# Patient Record
Sex: Female | Born: 1955 | Race: White | Hispanic: No | Marital: Single | Smoking: Current every day smoker
Health system: Southern US, Community
[De-identification: ages and names within clinical notes are randomized; demographics above are authoritative.]

## PROBLEM LIST (undated history)

## (undated) DIAGNOSIS — E7800 Pure hypercholesterolemia, unspecified: Secondary | ICD-10-CM

## (undated) DIAGNOSIS — I10 Essential (primary) hypertension: Secondary | ICD-10-CM

## (undated) DIAGNOSIS — F329 Major depressive disorder, single episode, unspecified: Secondary | ICD-10-CM

## (undated) DIAGNOSIS — C801 Malignant (primary) neoplasm, unspecified: Secondary | ICD-10-CM

## (undated) DIAGNOSIS — E079 Disorder of thyroid, unspecified: Secondary | ICD-10-CM

## (undated) DIAGNOSIS — F32A Depression, unspecified: Secondary | ICD-10-CM

## (undated) HISTORY — PX: PACEMAKER IMPLANT: EP1218

---

## 2008-06-28 ENCOUNTER — Emergency Department: Payer: Self-pay | Admitting: Emergency Medicine

## 2010-03-28 ENCOUNTER — Ambulatory Visit: Payer: Self-pay | Admitting: Family Medicine

## 2017-04-04 ENCOUNTER — Encounter: Payer: Self-pay | Admitting: Emergency Medicine

## 2017-04-04 ENCOUNTER — Emergency Department
Admission: EM | Admit: 2017-04-04 | Discharge: 2017-04-04 | Disposition: A | Discharge status: Home / Self Care | Payer: Medicaid Other | Attending: Emergency Medicine | Admitting: Emergency Medicine

## 2017-04-04 DIAGNOSIS — R45851 Suicidal ideations: Secondary | ICD-10-CM | POA: Diagnosis present

## 2017-04-04 DIAGNOSIS — F332 Major depressive disorder, recurrent severe without psychotic features: Secondary | ICD-10-CM | POA: Diagnosis not present

## 2017-04-04 DIAGNOSIS — I10 Essential (primary) hypertension: Secondary | ICD-10-CM | POA: Insufficient documentation

## 2017-04-04 DIAGNOSIS — E039 Hypothyroidism, unspecified: Secondary | ICD-10-CM

## 2017-04-04 DIAGNOSIS — F329 Major depressive disorder, single episode, unspecified: Secondary | ICD-10-CM | POA: Diagnosis not present

## 2017-04-04 DIAGNOSIS — Z79899 Other long term (current) drug therapy: Secondary | ICD-10-CM | POA: Insufficient documentation

## 2017-04-04 DIAGNOSIS — F339 Major depressive disorder, recurrent, unspecified: Secondary | ICD-10-CM

## 2017-04-04 DIAGNOSIS — F32A Depression, unspecified: Secondary | ICD-10-CM

## 2017-04-04 HISTORY — DX: Disorder of thyroid, unspecified: E07.9

## 2017-04-04 HISTORY — DX: Pure hypercholesterolemia, unspecified: E78.00

## 2017-04-04 HISTORY — DX: Essential (primary) hypertension: I10

## 2017-04-04 LAB — CBC
HEMATOCRIT: 42.6 % (ref 35.0–47.0)
HEMOGLOBIN: 14.6 g/dL (ref 12.0–16.0)
MCH: 33.8 pg (ref 26.0–34.0)
MCHC: 34.4 g/dL (ref 32.0–36.0)
MCV: 98.4 fL (ref 80.0–100.0)
Platelets: 294 10*3/uL (ref 150–440)
RBC: 4.33 MIL/uL (ref 3.80–5.20)
RDW: 13.8 % (ref 11.5–14.5)
WBC: 9.2 10*3/uL (ref 3.6–11.0)

## 2017-04-04 LAB — COMPREHENSIVE METABOLIC PANEL
ALT: 109 U/L — ABNORMAL HIGH (ref 14–54)
ANION GAP: 9 (ref 5–15)
AST: 78 U/L — ABNORMAL HIGH (ref 15–41)
Albumin: 4.2 g/dL (ref 3.5–5.0)
Alkaline Phosphatase: 82 U/L (ref 38–126)
BUN: 15 mg/dL (ref 6–20)
CHLORIDE: 103 mmol/L (ref 101–111)
CO2: 26 mmol/L (ref 22–32)
CREATININE: 0.94 mg/dL (ref 0.44–1.00)
Calcium: 9.3 mg/dL (ref 8.9–10.3)
Glucose, Bld: 101 mg/dL — ABNORMAL HIGH (ref 65–99)
POTASSIUM: 4.2 mmol/L (ref 3.5–5.1)
Sodium: 138 mmol/L (ref 135–145)
TOTAL PROTEIN: 7.7 g/dL (ref 6.5–8.1)
Total Bilirubin: 1 mg/dL (ref 0.3–1.2)

## 2017-04-04 LAB — URINE DRUG SCREEN, QUALITATIVE (ARMC ONLY)
Amphetamines, Ur Screen: NOT DETECTED
BARBITURATES, UR SCREEN: NOT DETECTED
BENZODIAZEPINE, UR SCRN: NOT DETECTED
COCAINE METABOLITE, UR ~~LOC~~: NOT DETECTED
Cannabinoid 50 Ng, Ur ~~LOC~~: POSITIVE — AB
MDMA (Ecstasy)Ur Screen: NOT DETECTED
METHADONE SCREEN, URINE: NOT DETECTED
OPIATE, UR SCREEN: NOT DETECTED
PHENCYCLIDINE (PCP) UR S: NOT DETECTED
Tricyclic, Ur Screen: NOT DETECTED

## 2017-04-04 LAB — ACETAMINOPHEN LEVEL: Acetaminophen (Tylenol), Serum: <10 ug/mL — ABNORMAL LOW (ref 10–30)

## 2017-04-04 LAB — SALICYLATE LEVEL: Salicylate Lvl: <7 mg/dL (ref 2.8–30.0)

## 2017-04-04 LAB — T4, FREE: Free T4: 0.86 ng/dL (ref 0.61–1.12)

## 2017-04-04 LAB — ETHANOL: Alcohol, Ethyl (B): <5 mg/dL (ref <5)

## 2017-04-04 LAB — TSH: TSH: 10.216 u[IU]/mL — ABNORMAL HIGH (ref 0.350–4.500)

## 2017-04-04 MED ORDER — ARIPIPRAZOLE 5 MG PO TABS: 5.0000 mg | ORAL_TABLET | Freq: Every day | ORAL | 1 refills | Status: DC

## 2017-04-04 NOTE — ED Notes (Signed)

## 2017-04-04 NOTE — ED Notes (Signed)
BEHAVIORAL HEALTH ROUNDING Patient sleeping: No. Patient alert and oriented: yes Behavior appropriate: Yes.  ; If no, describe:  Nutrition and fluids offered: yes Toileting and hygiene offered: Yes  Sitter present: q15 minute observations and security  monitoring Law enforcement present: Yes  ODS  

## 2017-04-04 NOTE — ED Triage Notes (Signed)
Pt reports history of depression. Has taken Zoloft in the past. Denies taking any antidepressants currently. Pt reports suicidal ideation but denies actual plan to commit suicide. Pt tearful in triage. Pt presents voluntarily.

## 2017-04-04 NOTE — ED Notes (Signed)
Patient assigned to appropriate care area.  Introduced self to pt  Patient oriented to unit/care area: Informed that, for their safety, care areas are designed for safety and monitored by security cameras at all times; and visiting hours explained to patient. Patient verbalizes understanding, and verbal contract for safety obtained  Environment secured  Clothing changed in triage

## 2017-04-04 NOTE — ED Provider Notes (Addendum)
[facility] Emergency Department Provider Note       Time seen: ----------------------------------------- 11:26 AM on 04/04/2017 -----------------------------------------     I have reviewed the triage vital signs and the nursing notes.   HISTORY   Chief Complaint Depression and Suicidal Ideation    HPI Debbie Henderson is a 61 y.o. female who presents to the ED for depression. Patient reports taking Effexor and Zoloft in the past. She is not currently taking any. She does describe some suicidal thought but not actively having any plan. She is very tearful and presents here voluntarily. She denies any other medical complaints.   Past Medical History:  Diagnosis Date  . Elevated cholesterol   . Hypertension   . Thyroid disease     There are no active problems to display for this patient.   No past surgical history on file.  Allergies Percocet [oxycodone-acetaminophen] and Vicodin [hydrocodone-acetaminophen]  Social History Social History  Substance Use Topics  . Smoking status: Not on file  . Smokeless tobacco: Not on file  . Alcohol use Not on file    Review of Systems Constitutional: Negative for fever. Cardiovascular: Negative for chest pain. Respiratory: Negative for shortness of breath. Gastrointestinal: Negative for abdominal pain, vomiting and diarrhea. Genitourinary: Negative for dysuria. Musculoskeletal: Negative for back pain. Skin: Negative for rash. Neurological: Negative for headaches, focal weakness or numbness. Psychiatric: Positive for depression, suicidal ideation  10-point ROS otherwise negative.  ____________________________________________   PHYSICAL EXAM:  VITAL SIGNS: ED Triage Vitals  Enc Vitals Group     BP 04/04/17 1100 (!) 182/85     Pulse Rate 04/04/17 1100 69     Resp 04/04/17 1100 18     Temp 04/04/17 1100 97.5 F (36.4 C)     Temp Source 04/04/17 1100 Oral     SpO2 04/04/17 1100 97 %   Weight 04/04/17 1101 200 lb (90.7 kg)     Height 04/04/17 1101 5\' 7"  (1.702 m)     Head Circumference --      Peak Flow --      Pain Score --      Pain Loc --      Pain Edu? --      Excl. in [place]? --     Constitutional: Alert and oriented. Tearful, in no distress Eyes: Conjunctivae are normal. PERRL. Normal extraocular movements. ENT   Head: Normocephalic and atraumatic.   Nose: No congestion/rhinnorhea.   Mouth/Throat: Mucous membranes are moist.   Neck: No stridor. Cardiovascular: Normal rate, regular rhythm. No murmurs, rubs, or gallops. Respiratory: Normal respiratory effort without tachypnea nor retractions. Breath sounds are clear and equal bilaterally. No wheezes/rales/rhonchi. Gastrointestinal: Soft and nontender. Normal bowel sounds Musculoskeletal: Nontender with normal range of motion in extremities. No lower extremity tenderness nor edema. Neurologic:  Normal speech and language. No gross focal neurologic deficits are appreciated.  Skin:  Skin is warm, dry and intact. No rash noted. Psychiatric: Depressed and tearful ____________________________________________  ED COURSE:  Pertinent labs & imaging results that were available during my care of the patient were reviewed by me and considered in my medical decision making (see chart for details). Patient presents for depressive symptoms, we will assess with labs as indicated. Clinical Course as of Apr 04 1521  Fri Apr 04, 2017  1517 Patient has been cleared by Dr. Weber Cooks for discharge  [JW]    Clinical Course User Index [JW] Earleen Newport, MD   Procedures ____________________________________________   LABS (pertinent  positives/negatives)  Labs Reviewed  COMPREHENSIVE METABOLIC PANEL - Abnormal; Notable for the following:       Result Value   Glucose, Bld 101 (*)    AST 78 (*)    ALT 109 (*)    All other components within normal limits  ACETAMINOPHEN LEVEL - Abnormal; Notable for the  following:    Acetaminophen (Tylenol), Serum <10 (*)    All other components within normal limits  URINE DRUG SCREEN, QUALITATIVE ([facility] ONLY) - Abnormal; Notable for the following:    Cannabinoid 50 Ng, Ur [place] POSITIVE (*)    All other components within normal limits  TSH - Abnormal; Notable for the following:    TSH 10.216 (*)    All other components within normal limits  ETHANOL  SALICYLATE LEVEL  CBC  T4, FREE   ____________________________________________  FINAL ASSESSMENT AND PLAN  Major depressive disorder  Plan: Patient's labs were dictated above. Patient had presented for depression with possible suicidal ideation. She appears medically stable for psychiatric evaluation.  Patient was cleared by Dr. Weber Cooks for discharge and given a prescription for Abilify. Earleen Newport, MD   Note: This note was generated in part or whole with voice recognition software. Voice recognition is usually quite accurate but there are transcription errors that can and very often do occur. I apologize for any typographical errors that were not detected and corrected.     Earleen Newport, MD 04/04/17 1154    Earleen Newport, MD 04/04/17 1404    Earleen Newport, MD 04/04/17 [phone]

## 2017-04-04 NOTE — ED Notes (Signed)
Called lab ask about adding TSH , Wells Guiles from lab agree to add on TSH

## 2017-04-04 NOTE — ED Notes (Signed)
Patient gave Officer Mabe with [place] police permission  to give her black Samsung phone to her boyfriend Anthonette Legato , officer asked patient to take her phone out of bag and her bag was placed back .

## 2017-04-04 NOTE — ED Notes (Signed)
Lunch was given to patient 

## 2017-04-04 NOTE — Consult Note (Signed)
[place] Psychiatry Consult   Reason for Consult:  Consult for 61 year old woman who came voluntarily to the emergency room for evaluation of depression Referring Physician:  Jimmye Norman Patient Identification: Debbie Henderson MRN:  [id number] Principal Diagnosis: Severe recurrent major depression without psychotic features [facility]) Diagnosis:   Patient Active Problem List   Diagnosis Date Noted  . Severe recurrent major depression without psychotic features ([place]) [F33.2] 04/04/2017  . Hypothyroid [E03.9] 04/04/2017    Total Time spent with patient: 1 hour  Subjective:   Debbie Henderson is a 61 y.o. female patient admitted with "I've got a history of depression".  HPI:  Patient interviewed. Chart reviewed. 61 year old woman with a history of depression. She talked to her primary care doctor today and was referred to come here to the emergency room to be evaluated. Patient reports that she's had trouble with depression for many years. The symptoms come and go but her current spell is been going on at least all through the winter. Mood feels sad and down all the time. Has crying spells. Energy level is low. Lack of interest or energy to do things. She sleeps okay because she takes Klonopin at night. Eats okay. Denies having any suicidal thoughts or intentions at all. Not having any psychotic thoughts. Patient is currently not taking an antidepressant medicine having stopped her most recent medicine which was Zoloft because she thought it was not working. Doesn't report any other significant single major stressor.  Social history: Patient lives with her long-term boyfriend and her stepdaughter. Not working outside the home.  Medical history: His history of hypothyroidism and takes Synthroid for it. Notably her TSH is abnormal today suggesting she may not be on enough.  Substance abuse history: Patient admits that she uses marijuana fairly regularly 1 or 2 times a week also drinks alcohol  very occasionally can remember the last time she used heavily although she says she often will have a single drink most nights.  Past Psychiatric History: No history of psychiatric hospitalization. No history of suicide attempts. Patient has a long list of antidepressants she has tried including Effexor, Zoloft, Pristiq, Remeron, Wellbutrin. She says Effexor helped a little bit for a while although she didn't like the side effects and other medicines have not helped at all. She says that she occasionally will get manic-like spells but she clarifies that they never last even a full day. No apparent history of psychotic mania.  Risk to Self: Is patient at risk for suicide?: Yes Risk to Others:   Prior Inpatient Therapy:   Prior Outpatient Therapy:    Past Medical History:  Past Medical History:  Diagnosis Date  . Elevated cholesterol   . Hypertension   . Thyroid disease    No past surgical history on file. Family History: No family history on file. Family Psychiatric  History: She had a grandfather committed suicide Social History:  History  Alcohol use Not on file     History  Drug use: Unknown    Social History   Social History  . Marital status: Single    Spouse name: N/A  . Number of children: N/A  . Years of education: N/A   Social History Main Topics  . Smoking status: None  . Smokeless tobacco: None  . Alcohol use None  . Drug use: Unknown  . Sexual activity: Not Asked   Other Topics Concern  . None   Social History Narrative  . None   Additional Social History:  Allergies:   Allergies  Allergen Reactions  . Percocet [Oxycodone-Acetaminophen] Itching and Nausea And Vomiting  . Vicodin [Hydrocodone-Acetaminophen] Itching and Nausea And Vomiting    Labs:  Results for orders placed or performed during the hospital encounter of 04/04/17 (from the past 48 hour(s))  Comprehensive metabolic panel     Status: Abnormal   Collection Time: 04/04/17 11:09 AM   Result Value Ref Range   Sodium 138 135 - 145 mmol/L   Potassium 4.2 3.5 - 5.1 mmol/L   Chloride 103 101 - 111 mmol/L   CO2 26 22 - 32 mmol/L   Glucose, Bld 101 (H) 65 - 99 mg/dL   BUN 15 6 - 20 mg/dL   Creatinine, Ser 0.94 0.44 - 1.00 mg/dL   Calcium 9.3 8.9 - 10.3 mg/dL   Total Protein 7.7 6.5 - 8.1 g/dL   Albumin 4.2 3.5 - 5.0 g/dL   AST 78 (H) 15 - 41 U/L   ALT 109 (H) 14 - 54 U/L   Alkaline Phosphatase 82 38 - 126 U/L   Total Bilirubin 1.0 0.3 - 1.2 mg/dL   GFR calc non Af Amer >60 >60 mL/min   GFR calc Af Amer >60 >60 mL/min    Comment: (NOTE) The eGFR has been calculated using the CKD EPI equation. This calculation has not been validated in all clinical situations. eGFR's persistently <60 mL/min signify possible Chronic Kidney Disease.    Anion gap 9 5 - 15  Ethanol     Status: None   Collection Time: 04/04/17 11:09 AM  Result Value Ref Range   Alcohol, Ethyl (B) <5 <5 mg/dL    Comment:        LOWEST DETECTABLE LIMIT FOR SERUM ALCOHOL IS 5 mg/dL FOR MEDICAL PURPOSES ONLY   Salicylate level     Status: None   Collection Time: 04/04/17 11:09 AM  Result Value Ref Range   Salicylate Lvl <7.6 2.8 - 30.0 mg/dL  Acetaminophen level     Status: Abnormal   Collection Time: 04/04/17 11:09 AM  Result Value Ref Range   Acetaminophen (Tylenol), Serum <10 (L) 10 - 30 ug/mL    Comment:        THERAPEUTIC CONCENTRATIONS VARY SIGNIFICANTLY. A RANGE OF 10-30 ug/mL MAY BE AN EFFECTIVE CONCENTRATION FOR MANY PATIENTS. HOWEVER, SOME ARE BEST TREATED AT CONCENTRATIONS OUTSIDE THIS RANGE. ACETAMINOPHEN CONCENTRATIONS >150 ug/mL AT 4 HOURS AFTER INGESTION AND >50 ug/mL AT 12 HOURS AFTER INGESTION ARE OFTEN ASSOCIATED WITH TOXIC REACTIONS.   cbc     Status: None   Collection Time: 04/04/17 11:09 AM  Result Value Ref Range   WBC 9.2 3.6 - 11.0 K/uL   RBC 4.33 3.80 - 5.20 MIL/uL   Hemoglobin 14.6 12.0 - 16.0 g/dL   HCT 42.6 35.0 - 47.0 %   MCV 98.4 80.0 - 100.0 fL   MCH  33.8 26.0 - 34.0 pg   MCHC 34.4 32.0 - 36.0 g/dL   RDW 13.8 11.5 - 14.5 %   Platelets 294 150 - 440 K/uL  TSH     Status: Abnormal   Collection Time: 04/04/17 11:09 AM  Result Value Ref Range   TSH 10.216 (H) 0.350 - 4.500 uIU/mL    Comment: Performed by a 3rd Generation assay with a functional sensitivity of <=0.01 uIU/mL.  T4, free     Status: None   Collection Time: 04/04/17 11:09 AM  Result Value Ref Range   Free T4 0.86 0.61 - 1.12 ng/dL  Comment: (NOTE) Biotin ingestion may interfere with free T4 tests. If the results are inconsistent with the TSH level, previous test results, or the clinical presentation, then consider biotin interference. If needed, order repeat testing after stopping biotin.   Urine Drug Screen, Qualitative     Status: Abnormal   Collection Time: 04/04/17  1:29 PM  Result Value Ref Range   Tricyclic, Ur Screen NONE DETECTED NONE DETECTED   Amphetamines, Ur Screen NONE DETECTED NONE DETECTED   MDMA (Ecstasy)Ur Screen NONE DETECTED NONE DETECTED   Cocaine Metabolite,Ur [place] NONE DETECTED NONE DETECTED   Opiate, Ur Screen NONE DETECTED NONE DETECTED   Phencyclidine (PCP) Ur S NONE DETECTED NONE DETECTED   Cannabinoid 50 Ng, Ur [place] POSITIVE (A) NONE DETECTED   Barbiturates, Ur Screen NONE DETECTED NONE DETECTED   Benzodiazepine, Ur Scrn NONE DETECTED NONE DETECTED   Methadone Scn, Ur NONE DETECTED NONE DETECTED    Comment: (NOTE) 665  Tricyclics, urine               Cutoff 1000 ng/mL 200  Amphetamines, urine             Cutoff 1000 ng/mL 300  MDMA (Ecstasy), urine           Cutoff 500 ng/mL 400  Cocaine Metabolite, urine       Cutoff 300 ng/mL 500  Opiate, urine                   Cutoff 300 ng/mL 600  Phencyclidine (PCP), urine      Cutoff 25 ng/mL 700  Cannabinoid, urine              Cutoff 50 ng/mL 800  Barbiturates, urine             Cutoff 200 ng/mL 900  Benzodiazepine, urine           Cutoff 200 ng/mL 1000 Methadone, urine                Cutoff 300  ng/mL 1100 1200 The urine drug screen provides only a preliminary, unconfirmed 1300 analytical test result and should not be used for non-medical 1400 purposes. Clinical consideration and professional judgment should 1500 be applied to any positive drug screen result due to possible 1600 interfering substances. A more specific alternate chemical method 1700 must be used in order to obtain a confirmed analytical result.  1800 Gas chromato graphy / mass spectrometry (GC/MS) is the preferred 1900 confirmatory method.     No current facility-administered medications for this encounter.    Current Outpatient Prescriptions  Medication Sig Dispense Refill  . clonazePAM (KLONOPIN) 1 MG tablet Take 1 mg by mouth 2 (two) times daily.    . hydrochlorothiazide (HYDRODIURIL) 25 MG tablet Take 25 mg by mouth daily.    Marland Kitchen LOSARTAN POTASSIUM PO Take 1 tablet by mouth daily at 6 (six) AM.    . SYNTHROID 125 MCG tablet Take 125 mcg by mouth daily before breakfast.    . ARIPiprazole (ABILIFY) 5 MG tablet Take 1 tablet (5 mg total) by mouth daily. 30 tablet 1    Musculoskeletal: Strength & Muscle Tone: within normal limits Gait & Station: normal Patient leans: N/A  Psychiatric Specialty Exam: Physical Exam  Nursing note and vitals reviewed. Constitutional: She appears well-developed and well-nourished.  HENT:  Head: Normocephalic and atraumatic.  Eyes: Conjunctivae are normal. Pupils are equal, round, and reactive to light.  Neck: Normal range of motion.  Cardiovascular: Regular rhythm  and normal heart sounds.   Respiratory: Effort normal. No respiratory distress.  GI: Soft.  Musculoskeletal: Normal range of motion.  Neurological: She is alert.  Skin: Skin is warm and dry.  Psychiatric: Her speech is normal. Judgment normal. She is withdrawn. Thought content is not paranoid. Cognition and memory are normal. She exhibits a depressed mood. She expresses no homicidal and no suicidal ideation.     Review of Systems  Constitutional: Negative.   HENT: Negative.   Eyes: Negative.   Respiratory: Negative.   Cardiovascular: Negative.   Gastrointestinal: Negative.   Musculoskeletal: Negative.   Skin: Negative.   Neurological: Negative.   Psychiatric/Behavioral: Positive for depression. Negative for hallucinations, memory loss, substance abuse and suicidal ideas. The patient is not nervous/anxious and does not have insomnia.     Blood pressure (!) 182/85, pulse 69, temperature 97.5 F (36.4 C), temperature source Oral, resp. rate 18, height _0  (1.702 m), weight 90.7 kg (200 lb), SpO2 97 %.Body mass index is 31.32 kg/m.  General Appearance: Casual  Eye Contact:  Fair  Speech:  Normal Rate  Volume:  Normal  Mood:  Depressed  Affect:  Congruent  Thought Process:  Goal Directed  Orientation:  Full (Time, Place, and Person)  Thought Content:  Logical  Suicidal Thoughts:  No  Homicidal Thoughts:  No  Memory:  Immediate;   Good Recent;   Fair Remote;   Fair  Judgement:  Fair  Insight:  Fair  Psychomotor Activity:  Decreased  Concentration:  Concentration: Fair  Recall:  [employer] of Knowledge:  Fair  Language:  Fair  Akathisia:  No  Handed:  Right  AIMS (if indicated):     Assets:  Desire for Improvement Financial Resources/Insurance Housing Physical Health Resilience  ADL's:  Intact  Cognition:  WNL  Sleep:        Treatment Plan Summary: Daily contact with patient to assess and evaluate symptoms and progress in treatment, Medication management and Plan Patient was severe recurrent depression possibly some degree of bipolar like presentation. Currently depressed not psychotic not agitated. Denies any suicidal thoughts. Does not appear to meet commitment criteria or require inpatient treatment. Psychoeducation completed. Reviewed treatment options. Given how many antidepressants she says she has taken without benefit I suggest trying a low dose of Abilify. Side  effects reviewed. Patient agrees to the plan. 5 mg Abilify starting today. Prescription done with one refill. Also encourage her to follow-up with actual psychiatrist and mental health providers rather than her primary care doctor. She will be given referral to [place]. She can otherwise be released from the emergency room.  Disposition: Patient does not meet criteria for psychiatric inpatient admission. Supportive therapy provided about ongoing stressors.  Alethia Berthold, MD 04/04/2017 3:55 PM

## 2017-04-04 NOTE — BH Assessment (Signed)
Assessment Note  Debbie Henderson is an 61 y.o. female who presents the ER due to having concerns about her mental state. She reports of having a history of depression and have done well to manage it. However, the last several weeks it has worsened and she do not want it to get the point of having thoughts of ending her life. She denies SI/HI and.  Her PCP have prescribed her antidepressants and they've worked for a for a time and then they are no longer as effective. She was stared on Zoloft approximately 6 weeks ago and instead of improving, it cause her to regress. Patient further reports, in the past she was able to "get back normal" when the seasons changed. However, she haven't been able to return  to her baseline. On yesterday (04/03/2017), she went to bed a noon and remained sleep until this morning. She reports that was an indicator she need to talk with her doctor and she was advised to come to the ER.  During the interview, the patient was calm, cooperative and pleasant. She was able to answer the questions without any problems. At times, she became tearful but she was able gather herself and continue with the assessment. Patient denies SI/HI and no past or current gestures or attempts. She also denies having AV/H. She have no involvement with the legal system or with DSS. She admits to smoking THC and drinking alcohol.  Diagnosis: Depression  Past Medical History:  Past Medical History:  Diagnosis Date  . Elevated cholesterol   . Hypertension   . Thyroid disease     No past surgical history on file.  Family History: No family history on file.  Social History:  has no tobacco, alcohol, and drug history on file.  Additional Social History:  Alcohol / Drug Use Pain Medications: See PTA Prescriptions: See PTA Over the Counter: See PTA History of alcohol / drug use?: Yes Longest period of sobriety (when/how long): Unable to quantify Negative Consequences of Use:   (n/a) Withdrawal Symptoms:  (n/a) Substance #1 Name of Substance 1: Cannabis Substance #2 Name of Substance 2: Alcohol  CIWA: CIWA-Ar BP: (!) 152/87 Pulse Rate: 72 COWS:    Allergies:  Allergies  Allergen Reactions  . Percocet [Oxycodone-Acetaminophen] Itching and Nausea And Vomiting  . Vicodin [Hydrocodone-Acetaminophen] Itching and Nausea And Vomiting    Home Medications:  (Not in a hospital admission)  OB/GYN Status:  No LMP recorded. Patient has had a hysterectomy.  General Assessment Data Location of Assessment: [facility] ED TTS Assessment: In system Is this a Tele or Face-to-Face Assessment?: Face-to-Face Is this an Initial Assessment or a Re-assessment for this encounter?: Initial Assessment Marital status: Long term relationship Maiden name: n/a Is patient pregnant?: No Pregnancy Status: No Living Arrangements: Spouse/significant other Can pt return to current living arrangement?: Yes Admission Status: Voluntary Is patient capable of signing voluntary admission?: Yes Referral Source: Self/Family/Friend Insurance type: MCD & MCR  Medical Screening Exam ([place]) Medical Exam completed: Yes  Crisis Care Plan Living Arrangements: Spouse/significant other Legal Guardian: Other: (Self) Name of Psychiatrist: Reports of none Name of Therapist: Reports of none  Education Status Is patient currently in school?: No Current Grade: n/a Highest grade of school patient has completed: n/a Name of school: n/a Contact person: n/a  Risk to self with the past 6 months Suicidal Ideation: No Has patient been a risk to self within the past 6 months prior to admission? : No Suicidal Intent: No Has patient  had any suicidal intent within the past 6 months prior to admission? : No Is patient at risk for suicide?: No Suicidal Plan?: No Has patient had any suicidal plan within the past 6 months prior to admission? : No Access to Means: No What has been your use of  drugs/alcohol within the last 12 months?: Alcohol & Cannabis Previous Attempts/Gestures: No How many times?: 0 Other Self Harm Risks: Reports of none Triggers for Past Attempts: None known Intentional Self Injurious Behavior: None Family Suicide History: No (Grandfather kill self after he return from [employer] ) Recent stressful life event(s): Other (Comment) Persecutory voices/beliefs?: No Depression: Yes Depression Symptoms: Tearfulness, Fatigue, Loss of interest in usual pleasures, Feeling worthless/self pity Substance abuse history and/or treatment for substance abuse?: Yes Suicide prevention information given to non-admitted patients: Not applicable  Risk to Others within the past 6 months Homicidal Ideation: No Does patient have any lifetime risk of violence toward others beyond the six months prior to admission? : No Thoughts of Harm to Others: No Current Homicidal Intent: No Current Homicidal Plan: No Access to Homicidal Means: No Identified Victim: Reports of none History of harm to others?: No Assessment of Violence: None Noted Violent Behavior Description: Reports of none Does patient have access to weapons?: No Criminal Charges Pending?: No Does patient have a court date: No Is patient on probation?: No  Psychosis Hallucinations: None noted Delusions: None noted  Mental Status Report Appearance/Hygiene: In scrubs, Unremarkable Eye Contact: Good Motor Activity: Freedom of movement, Unremarkable Speech: Logical/coherent, Unremarkable Level of Consciousness: Alert Mood: Depressed, Sad, Helpless Affect: Appropriate to circumstance, Depressed, Sad Anxiety Level: Minimal Thought Processes: Coherent, Relevant Judgement: Unimpaired Orientation: Person, Place, Time, Situation, Appropriate for developmental age Obsessive Compulsive Thoughts/Behaviors: Minimal  Cognitive Functioning Concentration: Normal Memory: Recent Intact, Remote Intact IQ:  Average Insight: Fair Impulse Control: Fair Appetite: Good Weight Loss: 0 Weight Gain: 0 Sleep: Increased Total Hours of Sleep: 12 Vegetative Symptoms: None  ADLScreening [facility] Assessment Services) Patient's cognitive ability adequate to safely complete daily activities?: Yes Patient able to express need for assistance with ADLs?: Yes Independently performs ADLs?: Yes (appropriate for developmental age)  Prior Inpatient Therapy Prior Inpatient Therapy: No Prior Therapy Dates: Reports of none Prior Therapy Facilty/Provider(s): Reports of none Reason for Treatment: Reports of none  Prior Outpatient Therapy Prior Outpatient Therapy: No Prior Therapy Dates: Reports of none Prior Therapy Facilty/Provider(s): Reports of none Reason for Treatment: Reports of none Does patient have an ACCT team?: No Does patient have Intensive In-House Services?  : No Does patient have [facility] services? : No Does patient have P4CC services?: No  ADL Screening (condition at time of admission) Patient's cognitive ability adequate to safely complete daily activities?: Yes Is the patient deaf or have difficulty hearing?: No Does the patient have difficulty seeing, even when wearing glasses/contacts?: No Does the patient have difficulty concentrating, remembering, or making decisions?: No Patient able to express need for assistance with ADLs?: Yes Does the patient have difficulty dressing or bathing?: No Independently performs ADLs?: Yes (appropriate for developmental age) Does the patient have difficulty walking or climbing stairs?: No Weakness of Legs: None Weakness of Arms/Hands: None  Home Assistive Devices/Equipment Home Assistive Devices/Equipment: None  Therapy Consults (therapy consults require a physician order) PT Evaluation Needed: No OT Evalulation Needed: No SLP Evaluation Needed: No Abuse/Neglect Assessment (Assessment to be complete while patient is alone) Physical Abuse:  Denies Verbal Abuse: Denies Sexual Abuse: Denies Exploitation of patient/patient's resources: Denies Self-Neglect: Denies  Values / Beliefs Cultural Requests During Hospitalization: None Spiritual Requests During Hospitalization: None Consults Spiritual Care Consult Needed: No Social Work Consult Needed: No Regulatory affairs officer (For Healthcare) Does Patient Have a Medical Advance Directive?: No Nutrition Screen- [place] Adult/WL/AP Patient's home diet: Regular  Additional Information 1:1 In Past 12 Months?: No CIRT Risk: No Elopement Risk: No Does patient have medical clearance?: Yes  Child/Adolescent Assessment Running Away Risk: Denies (Patient is an adult)  Disposition:  Disposition Initial Assessment Completed for this Encounter: Yes Disposition of Patient: Other dispositions (ER MD Ordered Psych Consult)  On Site Evaluation by:   Reviewed with Physician:    Gunnar Fusi MS, LCAS, LPC, [place], CCSI Therapeutic Triage Specialist 04/04/2017 6:10 PM

## 2017-04-04 NOTE — ED Notes (Signed)
Nurse Amy in room with patient at this time

## 2018-07-21 ENCOUNTER — Encounter: Payer: Self-pay | Admitting: Emergency Medicine

## 2018-07-21 ENCOUNTER — Other Ambulatory Visit: Payer: Self-pay

## 2018-07-21 ENCOUNTER — Emergency Department
Admission: EM | Admit: 2018-07-21 | Discharge: 2018-07-21 | Disposition: A | Discharge status: Home / Self Care | Payer: Medicaid Other | Attending: Emergency Medicine | Admitting: Emergency Medicine

## 2018-07-21 DIAGNOSIS — F329 Major depressive disorder, single episode, unspecified: Secondary | ICD-10-CM | POA: Insufficient documentation

## 2018-07-21 DIAGNOSIS — Z950 Presence of cardiac pacemaker: Secondary | ICD-10-CM | POA: Insufficient documentation

## 2018-07-21 DIAGNOSIS — F17200 Nicotine dependence, unspecified, uncomplicated: Secondary | ICD-10-CM | POA: Diagnosis present

## 2018-07-21 DIAGNOSIS — I10 Essential (primary) hypertension: Secondary | ICD-10-CM | POA: Diagnosis not present

## 2018-07-21 DIAGNOSIS — F17210 Nicotine dependence, cigarettes, uncomplicated: Secondary | ICD-10-CM | POA: Insufficient documentation

## 2018-07-21 DIAGNOSIS — F339 Major depressive disorder, recurrent, unspecified: Secondary | ICD-10-CM | POA: Diagnosis present

## 2018-07-21 DIAGNOSIS — Z79899 Other long term (current) drug therapy: Secondary | ICD-10-CM | POA: Insufficient documentation

## 2018-07-21 DIAGNOSIS — E039 Hypothyroidism, unspecified: Secondary | ICD-10-CM | POA: Diagnosis present

## 2018-07-21 DIAGNOSIS — F32A Depression, unspecified: Secondary | ICD-10-CM

## 2018-07-21 HISTORY — DX: Major depressive disorder, single episode, unspecified: F32.9

## 2018-07-21 HISTORY — DX: Depression, unspecified: F32.A

## 2018-07-21 LAB — CBC
HCT: 40.9 % (ref 35.0–47.0)
Hemoglobin: 14.1 g/dL (ref 12.0–16.0)
MCH: 32.6 pg (ref 26.0–34.0)
MCHC: 34.5 g/dL (ref 32.0–36.0)
MCV: 94.4 fL (ref 80.0–100.0)
PLATELETS: 316 10*3/uL (ref 150–440)
RBC: 4.33 MIL/uL (ref 3.80–5.20)
RDW: 12.2 % (ref 11.5–14.5)
WBC: 10.6 10*3/uL (ref 3.6–11.0)

## 2018-07-21 LAB — URINE DRUG SCREEN, QUALITATIVE (ARMC ONLY)
Amphetamines, Ur Screen: NOT DETECTED
Barbiturates, Ur Screen: NOT DETECTED
CANNABINOID 50 NG, UR ~~LOC~~: POSITIVE — AB
COCAINE METABOLITE, UR ~~LOC~~: NOT DETECTED
MDMA (ECSTASY) UR SCREEN: NOT DETECTED
Methadone Scn, Ur: NOT DETECTED
OPIATE, UR SCREEN: NOT DETECTED
PHENCYCLIDINE (PCP) UR S: NOT DETECTED
Tricyclic, Ur Screen: NOT DETECTED

## 2018-07-21 LAB — COMPREHENSIVE METABOLIC PANEL
ALT: 33 U/L (ref 0–44)
AST: 28 U/L (ref 15–41)
Albumin: 4 g/dL (ref 3.5–5.0)
Alkaline Phosphatase: 90 U/L (ref 38–126)
Anion gap: 11 (ref 5–15)
BILIRUBIN TOTAL: 1 mg/dL (ref 0.3–1.2)
BUN: 19 mg/dL (ref 8–23)
CHLORIDE: 104 mmol/L (ref 98–111)
CO2: 26 mmol/L (ref 22–32)
CREATININE: 1.01 mg/dL — AB (ref 0.44–1.00)
Calcium: 9.6 mg/dL (ref 8.9–10.3)
GFR, EST NON AFRICAN AMERICAN: 58 mL/min — AB (ref >60)
Glucose, Bld: 105 mg/dL — ABNORMAL HIGH (ref 70–99)
POTASSIUM: 3 mmol/L — AB (ref 3.5–5.1)
Sodium: 141 mmol/L (ref 135–145)
TOTAL PROTEIN: 7.3 g/dL (ref 6.5–8.1)

## 2018-07-21 LAB — ETHANOL: Alcohol, Ethyl (B): <10 mg/dL (ref <10)

## 2018-07-21 MED ORDER — RISPERIDONE 2 MG PO TABS: 2.0000 mg | ORAL_TABLET | Freq: Every day | ORAL | 1 refills | Status: AC

## 2018-07-21 MED ORDER — RISPERIDONE 1 MG PO TABS: 2.0000 mg | ORAL_TABLET | Freq: Every day | ORAL | Status: DC

## 2018-07-21 MED ORDER — VENLAFAXINE HCL ER 75 MG PO CP24: 150.0000 mg | ORAL_CAPSULE | Freq: Every day | ORAL | 2 refills | Status: AC

## 2018-07-21 MED ORDER — VENLAFAXINE HCL ER 75 MG PO CP24: 150.0000 mg | ORAL_CAPSULE | Freq: Every day | ORAL | Status: DC

## 2018-07-21 MED ORDER — RISPERIDONE 1 MG PO TABS
2.0000 mg | ORAL_TABLET | Freq: Every day | ORAL | Status: DC
Start: 2018-07-21 — End: 2018-07-21

## 2018-07-21 NOTE — Discharge Instructions (Addendum)
You have been seen in the emergency department for a  psychiatric concern. You have been evaluated both medically as well as psychiatrically. Please follow-up with your outpatient resources provided. Return to the emergency department for any worsening symptoms, or any thoughts of hurting yourself or anyone else so that we may attempt to help you. 

## 2018-07-21 NOTE — ED Notes (Signed)
Patient discharged home, to follow up with outpatient treatment, patient received discharge papers and prescription for medication. Patient received belongings and verbalized she has received all of her belongings. Patient appropriate and cooperative, Denies SI/HI AVH. Vital signs taken. NAD noted.

## 2018-07-21 NOTE — ED Provider Notes (Signed)
-----------------------------------------   6:28 PM on 07/21/2018 -----------------------------------------  Patient has been seen by psychiatry, they believe the patient is safe for discharge from a psychiatric standpoint.  Patient has follow-up at [facility].   Harvest Dark, MD 07/21/18 [phone]

## 2018-07-21 NOTE — ED Notes (Addendum)
Pt dressed out into appropriate behavioral health clothing. Pt belongings consist of black flip flops, a pink/burgundy dress, a yellow ring with three black stones and several clear stones, a black hair bow, blue panties and a pink bra. Pt calm and cooperative while dressing out. Bag labeled with pt name. Husband took purse with him.

## 2018-07-21 NOTE — ED Notes (Signed)
Patient talking with Dr. Bary Leriche

## 2018-07-21 NOTE — ED Provider Notes (Signed)
[facility] Emergency Department Provider Note  ____________________________________________   First MD Initiated Contact with Patient 07/21/18 1211     (approximate)  I have reviewed the triage vital signs and the nursing notes.   HISTORY  Chief Complaint Depression   HPI Debbie Henderson is a 62 y.o. female who self presents to the emergency department requesting to speak to a psychiatrist regarding her depression.  She has a long-standing history of depression for which she takes Abilify however she says that it is not currently adequately treating her symptoms.  Symptoms are moderate severity worsened by interpersonal conflict and somewhat improved when she is able to "talk to someone".  She denies actually trying to hurt herself.  She wanted to come to the emergency department before her symptoms became severe.  She denies drug or alcohol use at this point.    Past Medical History:  Diagnosis Date  . Depression   . Elevated cholesterol   . Hypertension   . Thyroid disease     Patient Active Problem List   Diagnosis Date Noted  . Severe recurrent major depression without psychotic features ([place]) 04/04/2017  . Hypothyroid 04/04/2017    Past Surgical History:  Procedure Laterality Date  . PACEMAKER IMPLANT      Prior to Admission medications   Medication Sig Start Date End Date Taking? Authorizing Provider  ARIPiprazole (ABILIFY) 5 MG tablet Take 1 tablet (5 mg total) by mouth daily. 04/04/17   Clapacs, Madie Reno, MD  clonazePAM (KLONOPIN) 1 MG tablet Take 1 mg by mouth 2 (two) times daily.    [provider]  hydrochlorothiazide (HYDRODIURIL) 25 MG tablet Take 25 mg by mouth daily.    [provider]  LOSARTAN POTASSIUM PO Take 1 tablet by mouth daily at 6 (six) AM.    [provider]  SYNTHROID 125 MCG tablet Take 125 mcg by mouth daily before breakfast.    [provider]    Allergies Percocet  [oxycodone-acetaminophen] and Vicodin [hydrocodone-acetaminophen]  No family history on file.  Social History Social History   Tobacco Use  . Smoking status: Current Every Day Smoker  . Smokeless tobacco: Never Used  Substance Use Topics  . Alcohol use: Not Currently  . Drug use: Not on file    Review of Systems Constitutional: No fever/chills Eyes: No visual changes. ENT: No sore throat. Cardiovascular: Denies chest pain. Respiratory: Denies shortness of breath. Gastrointestinal: No abdominal pain.  No nausea, no vomiting.  No diarrhea.  No constipation. Genitourinary: Negative for dysuria. Musculoskeletal: Negative for back pain. Skin: Negative for rash. Neurological: Negative for headaches, focal weakness or numbness.   ____________________________________________   PHYSICAL EXAM:  VITAL SIGNS: ED Triage Vitals  Enc Vitals Group     BP 07/21/18 1141 113/73     Pulse Rate 07/21/18 1141 80     Resp 07/21/18 1141 16     Temp 07/21/18 1141 98 F (36.7 C)     Temp Source 07/21/18 1141 Oral     SpO2 07/21/18 1141 94 %     Weight 07/21/18 1142 200 lb (90.7 kg)     Height 07/21/18 1142 5\' 6"  (1.676 m)     Head Circumference --      Peak Flow --      Pain Score 07/21/18 1142 3     Pain Loc --      Pain Edu? --      Excl. in [place]? --  Constitutional: Alert and oriented x4 appears sad but nontoxic Eyes: PERRL EOMI. Head: Atraumatic. Nose: No congestion/rhinnorhea. Mouth/Throat: No trismus Neck: No stridor.   Cardiovascular: Normal rate, regular rhythm. Grossly normal heart sounds.  Good peripheral circulation. Respiratory: Normal respiratory effort.  No retractions. Lungs CTAB and moving good air Gastrointestinal: Soft nontender Musculoskeletal: No lower extremity edema   Neurologic:  Normal speech and language. No gross focal neurologic deficits are appreciated. Skin:  Skin is warm, dry and intact. No rash noted. Psychiatric: Sad  affect    ____________________________________________   DIFFERENTIAL includes but not limited to  Depression, suicidal ideation, suicide attempt, drug overdose ____________________________________________   LABS (all labs ordered are listed, but only abnormal results are displayed)  Labs Reviewed  COMPREHENSIVE METABOLIC PANEL - Abnormal; Notable for the following components:      Result Value   Potassium 3.0 (*)    Glucose, Bld 105 (*)    Creatinine, Ser 1.01 (*)    GFR calc non Af Amer 58 (*)    All other components within normal limits  URINE DRUG SCREEN, QUALITATIVE ([facility] ONLY) - Abnormal; Notable for the following components:   Cannabinoid 50 Ng, Ur [place] POSITIVE (*)    Benzodiazepine, Ur Scrn TEST NOT PERFORMED, REAGENT NOT AVAILABLE (*)    All other components within normal limits  ETHANOL  CBC    Lab work reviewed by me shows she is cannabis positive otherwise unremarkable __________________________________________  EKG   ____________________________________________  RADIOLOGY   ____________________________________________   PROCEDURES  Procedure(s) performed: no  Procedures  Critical Care performed: no  ____________________________________________   INITIAL IMPRESSION / ASSESSMENT AND PLAN / ED COURSE  Pertinent labs & imaging results that were available during my care of the patient were reviewed by me and considered in my medical decision making (see chart for details).   As part of my medical decision making, I reviewed the following data within the [place] History obtained from family if available, nursing notes, old chart and ekg, as well as notes from prior ED visits.  The patient contracts for safety and is not an imminent danger to herself.  She consents to stay voluntarily and I will consult psychiatry.  She is medically stable for psychiatric evaluation at this point.       ____________________________________________   FINAL CLINICAL IMPRESSION(S) / ED DIAGNOSES  Final diagnoses:  Depression, unspecified depression type      NEW MEDICATIONS STARTED DURING THIS VISIT:  New Prescriptions   No medications on file     Note:  This document was prepared using Dragon voice recognition software and may include unintentional dictation errors.     Darel Hong, MD 07/21/18 [phone]

## 2018-07-21 NOTE — Consult Note (Signed)
Debbie Henderson has a history of depression. Abilify prescribed by Dr. Weber Cooks here stopped working and she experiences worsening of depression and anxiety. Daughter is getting married in one month and the patient tries to seek help.  PLAN: She does not meet criteria for IVC. I will start Effexor XR 150 mg daily and Risperdal 2 mg nightly. She will follow up with [facility]. Full consult to follow.

## 2018-07-21 NOTE — ED Notes (Signed)

## 2018-07-21 NOTE — ED Triage Notes (Signed)
Says she is on abilify and another med, but lately has been getting more and more depressed. Having bad dreams and feels fearful all the time.  Says she does not have any SI or HI.  She apperars a bit shakey and upset, but she is coopertive and has good eye contact.

## 2018-07-21 NOTE — Consult Note (Signed)
[facility] Consult   Reason for Consult:  Depression, anxiety Referring Physician:  Dr. Mable Paris Patient Identification: Debbie Henderson MRN:  [id number] Principal Diagnosis: Major depressive disorder, recurrent episode with anxious distress [facility]) Diagnosis:   Patient Active Problem List   Diagnosis Date Noted  . Major depressive disorder, recurrent episode with anxious distress ([place]) [F33.9] 04/04/2017    Priority: High  . Tobacco use disorder [F17.200] 07/21/2018  . Hypothyroid [E03.9] 04/04/2017    Total Time spent with patient: 1 hour   Identifying data. Debbie Henderson is a 62 year old female with a history of depression.  Chief coplaint. "Abilify is not working anymore."  History odf present illness. Information was obtained from the patient and the chart. The patient comes to the ER complaining of worsening of depression for one month. She feels sad, has problems sleeping, appetite is poor, she reports anhedonia and loss of interest in any activities, social isolation, crying spells and social anxiety. She has not been able to take care of her household and her boyfriend has been helping with cooking and shoping. She denies feeling suicidal. She denies psychotic symptoms. She does not use drugs or alcohol. She was seen in the ER by Dr. Weber Cooks one year ago. She responded well to Abilify initially but it stopped working even though her PCP doubled the dose. Her major stress is approaching wedding of her daughter in one month. She would like to be better for the ceremony.  Past psychiatric history. Long history of depression. She has been tried on all antidepressants I could name but they all stopped working after a while. Denies suicide attempts or hospitalizations.  Family psychiatric history. Mother with depression.  Social history. Lives with a boyfriend of 15 years. Retired. Has Medicaid.  Risk to Self:   Risk to Others:   Prior Inpatient Therapy:   Prior  Outpatient Therapy:    Past Medical History:  Past Medical History:  Diagnosis Date  . Depression   . Elevated cholesterol   . Hypertension   . Thyroid disease     Past Surgical History:  Procedure Laterality Date  . PACEMAKER IMPLANT     Family History: History reviewed. No pertinent family history.   Social History:  Social History   Substance and Sexual Activity  Alcohol Use Not Currently     Social History   Substance and Sexual Activity  Drug Use Not on file    Social History   Socioeconomic History  . Marital status: Single    Spouse name: Not on file  . Number of children: Not on file  . Years of education: Not on file  . Highest education level: Not on file  Occupational History  . Not on file  Social Needs  . Financial resource strain: Not on file  . Food insecurity:    Worry: Not on file    Inability: Not on file  . Transportation needs:    Medical: Not on file    Non-medical: Not on file  Tobacco Use  . Smoking status: Current Every Day Smoker  . Smokeless tobacco: Never Used  Substance and Sexual Activity  . Alcohol use: Not Currently  . Drug use: Not on file  . Sexual activity: Not on file  Lifestyle  . Physical activity:    Days per week: Not on file    Minutes per session: Not on file  . Stress: Not on file  Relationships  . Social connections:    Talks on phone: Not  on file    Gets together: Not on file    Attends religious service: Not on file    Active member of club or organization: Not on file    Attends meetings of clubs or organizations: Not on file    Relationship status: Not on file  Other Topics Concern  . Not on file  Social History Narrative  . Not on file   Additional Social History:    Allergies:   Allergies  Allergen Reactions  . Percocet [Oxycodone-Acetaminophen] Itching and Nausea And Vomiting  . Vicodin [Hydrocodone-Acetaminophen] Itching and Nausea And Vomiting    Labs:  Results for orders placed or  performed during the hospital encounter of 07/21/18 (from the past 48 hour(s))  Comprehensive metabolic panel     Status: Abnormal   Collection Time: 07/21/18 11:50 AM  Result Value Ref Range   Sodium 141 135 - 145 mmol/L   Potassium 3.0 (L) 3.5 - 5.1 mmol/L   Chloride 104 98 - 111 mmol/L   CO2 26 22 - 32 mmol/L   Glucose, Bld 105 (H) 70 - 99 mg/dL   BUN 19 8 - 23 mg/dL   Creatinine, Ser 1.01 (H) 0.44 - 1.00 mg/dL   Calcium 9.6 8.9 - 10.3 mg/dL   Total Protein 7.3 6.5 - 8.1 g/dL   Albumin 4.0 3.5 - 5.0 g/dL   AST 28 15 - 41 U/L   ALT 33 0 - 44 U/L   Alkaline Phosphatase 90 38 - 126 U/L   Total Bilirubin 1.0 0.3 - 1.2 mg/dL   GFR calc non Af Amer 58 (L) >60 mL/min   GFR calc Af Amer >60 >60 mL/min    Comment: (NOTE) The eGFR has been calculated using the CKD EPI equation. This calculation has not been validated in all clinical situations. eGFR's persistently <60 mL/min signify possible Chronic Kidney Disease.    Anion gap 11 5 - 15    Comment: Performed at [facility], [address] [place], [place] [address]  Ethanol     Status: None   Collection Time: 07/21/18 11:50 AM  Result Value Ref Range   Alcohol, Ethyl (B) <10 <10 mg/dL    Comment: (NOTE) Lowest detectable limit for serum alcohol is 10 mg/dL. For medical purposes only. Performed at [facility], [address] [place], [place] [address]   cbc     Status: None   Collection Time: 07/21/18 11:50 AM  Result Value Ref Range   WBC 10.6 3.6 - 11.0 K/uL   RBC 4.33 3.80 - 5.20 MIL/uL   Hemoglobin 14.1 12.0 - 16.0 g/dL   HCT 40.9 35.0 - 47.0 %   MCV 94.4 80.0 - 100.0 fL   MCH 32.6 26.0 - 34.0 pg   MCHC 34.5 32.0 - 36.0 g/dL   RDW 12.2 11.5 - 14.5 %   Platelets 316 150 - 440 K/uL    Comment: Performed at [facility], [address] [place], [place] [address]  Urine Drug Screen, Qualitative     Status: Abnormal   Collection Time: 07/21/18 11:50 AM  Result Value Ref Range    Tricyclic, Ur Screen NONE DETECTED NONE DETECTED   Amphetamines, Ur Screen NONE DETECTED NONE DETECTED   MDMA (Ecstasy)Ur Screen NONE DETECTED NONE DETECTED   Cocaine Metabolite,Ur [place] NONE DETECTED NONE DETECTED   Opiate, Ur Screen NONE DETECTED NONE DETECTED   Phencyclidine (PCP) Ur S NONE DETECTED NONE DETECTED   Cannabinoid 50 Ng, Ur [place] POSITIVE (  A) NONE DETECTED   Barbiturates, Ur Screen NONE DETECTED NONE DETECTED   Benzodiazepine, Ur Scrn TEST NOT PERFORMED, REAGENT NOT AVAILABLE (A) NONE DETECTED   Methadone Scn, Ur NONE DETECTED NONE DETECTED    Comment: (NOTE) Tricyclics + metabolites, urine    Cutoff 1000 ng/mL Amphetamines + metabolites, urine  Cutoff 1000 ng/mL MDMA (Ecstasy), urine              Cutoff 500 ng/mL Cocaine Metabolite, urine          Cutoff 300 ng/mL Opiate + metabolites, urine        Cutoff 300 ng/mL Phencyclidine (PCP), urine         Cutoff 25 ng/mL Cannabinoid, urine                 Cutoff 50 ng/mL Barbiturates + metabolites, urine  Cutoff 200 ng/mL Benzodiazepine, urine              Cutoff 200 ng/mL Methadone, urine                   Cutoff 300 ng/mL The urine drug screen provides only a preliminary, unconfirmed analytical test result and should not be used for non-medical purposes. Clinical consideration and professional judgment should be applied to any positive drug screen result due to possible interfering substances. A more specific alternate chemical method must be used in order to obtain a confirmed analytical result. Gas chromatography / mass spectrometry (GC/MS) is the preferred confirmat ory method. Performed at [facility], [address], [place],  [address]     Current Facility-Administered Medications  Medication Dose Route Frequency Provider Last Rate Last Dose  . risperiDONE (RISPERDAL) tablet 2 mg  2 mg Oral QHS Sheronica Corey B, MD      . Derrill Memo ON 07/22/2018] venlafaxine XR (EFFEXOR-XR) 24 hr capsule 150 mg   150 mg Oral Q breakfast Zackrey Dyar B, MD       Current Outpatient Medications  Medication Sig Dispense Refill  . hydrochlorothiazide (HYDRODIURIL) 25 MG tablet Take 25 mg by mouth daily.    Marland Kitchen LOSARTAN POTASSIUM PO Take 1 tablet by mouth daily at 6 (six) AM.    . risperiDONE (RISPERDAL) 2 MG tablet Take 1 tablet (2 mg total) by mouth at bedtime. 30 tablet 1  . SYNTHROID 125 MCG tablet Take 125 mcg by mouth daily before breakfast.    . venlafaxine XR (EFFEXOR XR) 75 MG 24 hr capsule Take 2 capsules (150 mg total) by mouth daily. 30 capsule 2    Musculoskeletal: Strength & Muscle Tone: within normal limits Gait & Station: normal Patient leans: N/A  Psychiatric Specialty Exam: Physical Exam  Nursing note and vitals reviewed. Psychiatric: Her speech is normal and behavior is normal. Judgment and thought content normal. Her mood appears anxious. Cognition and memory are normal.    Review of Systems  Neurological: Negative.   Psychiatric/Behavioral: Positive for depression. The patient is nervous/anxious.   All other systems reviewed and are negative.   Blood pressure 113/73, pulse 80, temperature 98 F (36.7 C), temperature source Oral, resp. rate 16, height _0  (1.676 m), weight 90.7 kg (200 lb), SpO2 94 %.Body mass index is 32.28 kg/m.  General Appearance: Casual  Eye Contact:  Good  Speech:  Clear and Coherent  Volume:  Normal  Mood:  Anxious and Depressed  Affect:  Tearful  Thought Process:  Goal Directed and Descriptions of Associations: Intact  Orientation:  Full (Time, Place, and  Person)  Thought Content:  WDL  Suicidal Thoughts:  No  Homicidal Thoughts:  No  Memory:  Immediate;   Fair Recent;   Fair Remote;   Fair  Judgement:  Fair  Insight:  Present  Psychomotor Activity:  Normal  Concentration:  Concentration: Fair and Attention Span: Fair  Recall:  [employer] of Knowledge:  Fair  Language:  Fair  Akathisia:  No  Handed:  Right  AIMS (if indicated):      Assets:  Communication Skills Desire for Improvement Financial Resources/Insurance Housing Intimacy Physical Health Resilience Social Support  ADL's:  Intact  Cognition:  WNL  Sleep:        Treatment Plan Summary: Daily contact with patient to assess and evaluate symptoms and progress in treatment and Medication management   PLAN: 1. Patient does not meet criteroa for IVC. Please discharge as appropriate.  2. I started Effexor XR 150 mg daily and Risperdal 2 mf nightly for depression. Rx given.  3. She will follow up with [facility], information provided.  Disposition: No evidence of imminent risk to self or others at present.   Patient does not meet criteria for psychiatric inpatient admission. Supportive therapy provided about ongoing stressors. Discussed crisis plan, support from social network, calling 911, coming to the Emergency Department, and calling Suicide Hotline.  Orson Slick, MD 07/21/2018 5:32 PM

## 2018-07-30 ENCOUNTER — Emergency Department: Payer: Medicaid Other

## 2018-07-30 ENCOUNTER — Other Ambulatory Visit: Payer: Self-pay

## 2018-07-30 ENCOUNTER — Observation Stay
Admission: EM | Admit: 2018-07-30 | Discharge: 2018-08-02 | Disposition: A | Discharge status: Home / Self Care | Payer: Medicaid Other | Attending: Internal Medicine | Admitting: Internal Medicine

## 2018-07-30 ENCOUNTER — Encounter: Payer: Self-pay | Admitting: Internal Medicine

## 2018-07-30 DIAGNOSIS — Z950 Presence of cardiac pacemaker: Secondary | ICD-10-CM | POA: Diagnosis not present

## 2018-07-30 DIAGNOSIS — R55 Syncope and collapse: Secondary | ICD-10-CM | POA: Diagnosis present

## 2018-07-30 DIAGNOSIS — I951 Orthostatic hypotension: Principal | ICD-10-CM | POA: Insufficient documentation

## 2018-07-30 DIAGNOSIS — I10 Essential (primary) hypertension: Secondary | ICD-10-CM | POA: Diagnosis not present

## 2018-07-30 DIAGNOSIS — E7800 Pure hypercholesterolemia, unspecified: Secondary | ICD-10-CM | POA: Diagnosis not present

## 2018-07-30 DIAGNOSIS — Z79899 Other long term (current) drug therapy: Secondary | ICD-10-CM | POA: Insufficient documentation

## 2018-07-30 DIAGNOSIS — Z6826 Body mass index (BMI) 26.0-26.9, adult: Secondary | ICD-10-CM | POA: Diagnosis not present

## 2018-07-30 DIAGNOSIS — E876 Hypokalemia: Secondary | ICD-10-CM | POA: Insufficient documentation

## 2018-07-30 DIAGNOSIS — E785 Hyperlipidemia, unspecified: Secondary | ICD-10-CM | POA: Diagnosis not present

## 2018-07-30 DIAGNOSIS — Z79890 Hormone replacement therapy (postmenopausal): Secondary | ICD-10-CM | POA: Diagnosis not present

## 2018-07-30 DIAGNOSIS — D72829 Elevated white blood cell count, unspecified: Secondary | ICD-10-CM | POA: Insufficient documentation

## 2018-07-30 DIAGNOSIS — E039 Hypothyroidism, unspecified: Secondary | ICD-10-CM | POA: Insufficient documentation

## 2018-07-30 DIAGNOSIS — F17200 Nicotine dependence, unspecified, uncomplicated: Secondary | ICD-10-CM | POA: Insufficient documentation

## 2018-07-30 DIAGNOSIS — R634 Abnormal weight loss: Secondary | ICD-10-CM | POA: Insufficient documentation

## 2018-07-30 DIAGNOSIS — N179 Acute kidney failure, unspecified: Secondary | ICD-10-CM | POA: Insufficient documentation

## 2018-07-30 DIAGNOSIS — F329 Major depressive disorder, single episode, unspecified: Secondary | ICD-10-CM | POA: Insufficient documentation

## 2018-07-30 LAB — COMPREHENSIVE METABOLIC PANEL
ALK PHOS: 74 U/L (ref 38–126)
ALT: 16 U/L (ref 0–44)
ANION GAP: 14 (ref 5–15)
AST: 19 U/L (ref 15–41)
Albumin: 4.3 g/dL (ref 3.5–5.0)
BUN: 42 mg/dL — ABNORMAL HIGH (ref 8–23)
CALCIUM: 9.8 mg/dL (ref 8.9–10.3)
CHLORIDE: 103 mmol/L (ref 98–111)
CO2: 24 mmol/L (ref 22–32)
CREATININE: 1.54 mg/dL — AB (ref 0.44–1.00)
GFR, EST AFRICAN AMERICAN: 40 mL/min — AB (ref >60)
GFR, EST NON AFRICAN AMERICAN: 35 mL/min — AB (ref >60)
Glucose, Bld: 89 mg/dL (ref 70–99)
Potassium: 3 mmol/L — ABNORMAL LOW (ref 3.5–5.1)
Sodium: 141 mmol/L (ref 135–145)
Total Bilirubin: 1 mg/dL (ref 0.3–1.2)
Total Protein: 7.7 g/dL (ref 6.5–8.1)

## 2018-07-30 LAB — CBC WITH DIFFERENTIAL/PLATELET
BASOS PCT: 0 %
Basophils Absolute: 0 10*3/uL (ref 0–0.1)
EOS ABS: 0 10*3/uL (ref 0–0.7)
EOS PCT: 0 %
HCT: 43 % (ref 35.0–47.0)
HEMOGLOBIN: 14.8 g/dL (ref 12.0–16.0)
LYMPHS ABS: 3.8 10*3/uL — AB (ref 1.0–3.6)
Lymphocytes Relative: 27 %
MCH: 32.4 pg (ref 26.0–34.0)
MCHC: 34.5 g/dL (ref 32.0–36.0)
MCV: 93.9 fL (ref 80.0–100.0)
MONOS PCT: 7 %
Monocytes Absolute: 1 10*3/uL — ABNORMAL HIGH (ref 0.2–0.9)
NEUTROS PCT: 66 %
Neutro Abs: 9.5 10*3/uL — ABNORMAL HIGH (ref 1.4–6.5)
PLATELETS: 296 10*3/uL (ref 150–440)
RBC: 4.58 MIL/uL (ref 3.80–5.20)
RDW: 12.4 % (ref 11.5–14.5)
WBC: 14.4 10*3/uL — AB (ref 3.6–11.0)

## 2018-07-30 LAB — TROPONIN I

## 2018-07-30 MED ORDER — ONDANSETRON HCL 4 MG PO TABS: 4.0000 mg | ORAL_TABLET | Freq: Four times a day (QID) | ORAL | Status: DC | PRN

## 2018-07-30 MED ORDER — SODIUM CHLORIDE 0.9% FLUSH
3.0000 mL | Freq: Two times a day (BID) | INTRAVENOUS | Status: DC
  Administered 2018-07-30 – 2018-08-01 (×5): 3 mL via INTRAVENOUS

## 2018-07-30 MED ORDER — ENOXAPARIN SODIUM 40 MG/0.4ML ~~LOC~~ SOLN
40.0000 mg | SUBCUTANEOUS | Status: DC
  Administered 2018-07-30 – 2018-08-01 (×3): 40 mg via SUBCUTANEOUS
  Filled 2018-07-30 (×3): qty 0.4

## 2018-07-30 MED ORDER — SENNOSIDES-DOCUSATE SODIUM 8.6-50 MG PO TABS: 1.0000 | ORAL_TABLET | Freq: Every evening | ORAL | Status: DC | PRN

## 2018-07-30 MED ORDER — SODIUM CHLORIDE 0.9% FLUSH: 3.0000 mL | Freq: Two times a day (BID) | INTRAVENOUS | Status: DC

## 2018-07-30 MED ORDER — VENLAFAXINE HCL ER 75 MG PO CP24
150.0000 mg | ORAL_CAPSULE | Freq: Every day | ORAL | Status: DC
  Administered 2018-07-31 – 2018-08-02 (×3): 150 mg via ORAL
  Filled 2018-07-30 (×3): qty 2

## 2018-07-30 MED ORDER — ONDANSETRON HCL 4 MG/2ML IJ SOLN: 4.0000 mg | Freq: Four times a day (QID) | INTRAMUSCULAR | Status: DC | PRN

## 2018-07-30 MED ORDER — RISPERIDONE 1 MG PO TABS
2.0000 mg | ORAL_TABLET | Freq: Every day | ORAL | Status: DC
  Filled 2018-07-30 (×4): qty 2

## 2018-07-30 MED ORDER — ACETAMINOPHEN 650 MG RE SUPP: 650.0000 mg | Freq: Four times a day (QID) | RECTAL | Status: DC | PRN

## 2018-07-30 MED ORDER — ACETAMINOPHEN 325 MG PO TABS
650.0000 mg | ORAL_TABLET | Freq: Four times a day (QID) | ORAL | Status: DC | PRN
  Administered 2018-07-30 – 2018-07-31 (×4): 650 mg via ORAL
  Filled 2018-07-30 (×4): qty 2

## 2018-07-30 MED ORDER — SODIUM CHLORIDE 0.9% FLUSH
3.0000 mL | INTRAVENOUS | Status: DC | PRN
  Administered 2018-07-30: 3 mL via INTRAVENOUS
  Filled 2018-07-30: qty 3

## 2018-07-30 MED ORDER — SODIUM CHLORIDE 0.9 % IV SOLN: 250.0000 mL | INTRAVENOUS | Status: DC | PRN

## 2018-07-30 MED ORDER — LEVOTHYROXINE SODIUM 25 MCG PO TABS
125.0000 ug | ORAL_TABLET | Freq: Every day | ORAL | Status: DC
  Administered 2018-07-31 – 2018-08-02 (×3): 125 ug via ORAL
  Filled 2018-07-30 (×3): qty 1

## 2018-07-30 MED ORDER — POTASSIUM CHLORIDE CRYS ER 20 MEQ PO TBCR
40.0000 meq | EXTENDED_RELEASE_TABLET | Freq: Once | ORAL | Status: AC
  Administered 2018-07-30: 40 meq via ORAL
  Filled 2018-07-30: qty 2

## 2018-07-30 MED ORDER — LOSARTAN POTASSIUM 50 MG PO TABS
50.0000 mg | ORAL_TABLET | Freq: Every day | ORAL | Status: DC
  Administered 2018-07-31: 50 mg via ORAL
  Filled 2018-07-30: qty 1

## 2018-07-30 NOTE — Progress Notes (Signed)
Advanced care plan.  Purpose of the Encounter: CODE STATUS  Parties in Attendance: Patient  Patient's Decision Capacity: Good  Subjective/Patient's story: Presented to the emergency room for passing out   Objective/Medical story Has multiple episodes of syncope which needs evaluation History of pacemaker which needs interrogation   Goals of care determination:  Advance care directives and goals of care discussed Patient wants everything done which includes CPR and intubation if the need arises   CODE STATUS: Full code   Time spent discussing advanced care planning: 16 minutes

## 2018-07-30 NOTE — ED Triage Notes (Signed)
Pt here after dr office convinced pt to come to ED. Pt has been having syncopal episodes 3x a week for several weeks now. Pt reports she wakes up on the floor and is unaware of what has happened. Pt does report a pacemaker placed in April of this year. Vitals for EMS as follows; 129/66 63HR 98%RA 82BGL.

## 2018-07-30 NOTE — Progress Notes (Signed)
Patient admitted from the ED with syncopal episodes.  She says her only warning is that her vision goes black before she passes out.  This has been occurring for months.  She had a pacemaker placed at [facility] in April.  Her symptoms did not improve after this.

## 2018-07-30 NOTE — ED Notes (Signed)
Attempted to give report at this time, floor unable to take report at this time.

## 2018-07-30 NOTE — Progress Notes (Signed)
   07/30/18 2000  Clinical Encounter Type  Visited With Patient;Health care provider (nurse present in room during visit)  Visit Type Initial (order for prayer)  Referral From Nurse  Consult/Referral To [facility] responded to order for prayer.  Patient wanted prayers for healing, rest, and for her granddaughter.  Chaplain and patient prayed together.  Patient declined extended conversation due to desire to rest.  Chaplain spoke of 24/7 chaplain availability and encouraged patient to have chaplain paged as needed.

## 2018-07-30 NOTE — ED Notes (Signed)
Jancy, RN given report. 

## 2018-07-30 NOTE — ED Notes (Signed)
Attempted to give report. Unable to take report at this time

## 2018-07-30 NOTE — H&P (Addendum)
[facility] NAME: Debbie Henderson    MR#:  [id number]  DATE OF BIRTH:  Mar 28, 1956  DATE OF ADMISSION:  07/30/2018  PRIMARY CARE PHYSICIAN: Lavonne Chick, MD   REQUESTING/REFERRING PHYSICIAN:   CHIEF COMPLAINT:   Chief Complaint  Patient presents with  . Loss of Consciousness    HISTORY OF PRESENT ILLNESS: Debbie Henderson  is a 62 y.o. female with a known history of hyperlipidemia, hypertension, thyroid disease presented to the emergency room for passing out.  She has multiple episodes of syncope recently.  She has been passing out for the last 3 years according to the history given by her.  She follows up with cardiology at [facility].  She has a pacemaker which has been placed in April and has been interrogated in the emergency room and appears to been working normally.  First set of troponin is negative.  No complaints of any chest pain, shortness of breath.  Hospitalist service was consulted for further care.  PAST MEDICAL HISTORY:   Past Medical History:  Diagnosis Date  . Depression   . Elevated cholesterol   . Hypertension   . Thyroid disease     PAST SURGICAL HISTORY:  Past Surgical History:  Procedure Laterality Date  . PACEMAKER IMPLANT      SOCIAL HISTORY:  Social History   Tobacco Use  . Smoking status: Current Every Day Smoker  . Smokeless tobacco: Never Used  Substance Use Topics  . Alcohol use: Not Currently    FAMILY HISTORY: No family history on file.  DRUG ALLERGIES:  Allergies  Allergen Reactions  . Percocet [Oxycodone-Acetaminophen] Itching and Nausea And Vomiting  . Vicodin [Hydrocodone-Acetaminophen] Itching and Nausea And Vomiting    REVIEW OF SYSTEMS:   CONSTITUTIONAL: No fever, fatigue or weakness.  EYES: No blurred or double vision.  EARS, NOSE, AND THROAT: No tinnitus or ear pain.  RESPIRATORY: No cough, shortness of breath, wheezing or hemoptysis.   CARDIOVASCULAR: No chest pain, orthopnea, edema.  GASTROINTESTINAL: No nausea, vomiting, diarrhea or abdominal pain.  GENITOURINARY: No dysuria, hematuria.  ENDOCRINE: No polyuria, nocturia,  HEMATOLOGY: No anemia, easy bruising or bleeding SKIN: No rash or lesion. MUSCULOSKELETAL: No joint pain or arthritis.   NEUROLOGIC: No tingling, numbness, weakness.  Had syncope PSYCHIATRY: No anxiety or depression.   MEDICATIONS AT HOME:  Prior to Admission medications   Medication Sig Start Date End Date Taking? Authorizing Provider  hydrochlorothiazide (HYDRODIURIL) 25 MG tablet Take 25 mg by mouth daily.    [provider]  LOSARTAN POTASSIUM PO Take 1 tablet by mouth daily at 6 (six) AM.    [provider]  risperiDONE (RISPERDAL) 2 MG tablet Take 1 tablet (2 mg total) by mouth at bedtime. 07/21/18   Pucilowska, Wardell Honour, MD  SYNTHROID 125 MCG tablet Take 125 mcg by mouth daily before breakfast.    [provider]  venlafaxine XR (EFFEXOR XR) 75 MG 24 hr capsule Take 2 capsules (150 mg total) by mouth daily. 07/21/18 07/21/19  Pucilowska, Herma Ard B, MD      PHYSICAL EXAMINATION:   VITAL SIGNS: Blood pressure 110/69, pulse 69, temperature 97.7 F (36.5 C), temperature source Oral, resp. rate 16, height 5\' 7"  (1.702 m), weight 76.2 kg, SpO2 99 %.  GENERAL:  62 y.o.-year-old patient lying in the bed with no acute distress.  EYES: Pupils equal, round, reactive to light and accommodation. No scleral icterus. Extraocular muscles intact.  HEENT: Head atraumatic, normocephalic. Oropharynx and nasopharynx clear.  NECK:  Supple, no jugular venous distention. No thyroid enlargement, no tenderness.  LUNGS: Normal breath sounds bilaterally, no wheezing, rales,rhonchi or crepitation. No use of accessory muscles of respiration.  CARDIOVASCULAR: S1, S2 normal. No murmurs, rubs, or gallops.  ABDOMEN: Soft, nontender, nondistended. Bowel sounds present. No organomegaly or mass.   EXTREMITIES: No pedal edema, cyanosis, or clubbing.  NEUROLOGIC: Cranial nerves II through XII are intact. Muscle strength 5/5 in all extremities. Sensation intact. Gait not checked.  PSYCHIATRIC: The patient is alert and oriented x 3.  SKIN: No obvious rash, lesion, or ulcer.   LABORATORY PANEL:   CBC Recent Labs  Lab 07/30/18 1545  WBC 14.4*  HGB 14.8  HCT 43.0  PLT 296  MCV 93.9  MCH 32.4  MCHC 34.5  RDW 12.4  LYMPHSABS 3.8*  MONOABS 1.0*  EOSABS 0.0  BASOSABS 0.0   ------------------------------------------------------------------------------------------------------------------  Chemistries  Recent Labs  Lab 07/30/18 1545  NA 141  K 3.0*  CL 103  CO2 24  GLUCOSE 89  BUN 42*  CREATININE 1.54*  CALCIUM 9.8  AST 19  ALT 16  ALKPHOS 74  BILITOT 1.0   ------------------------------------------------------------------------------------------------------------------ estimated creatinine clearance is 39.8 mL/min (A) (by C-G formula based on SCr of 1.54 mg/dL (H)). ------------------------------------------------------------------------------------------------------------------ No results for input(s): TSH, T4TOTAL, T3FREE, THYROIDAB in the last 72 hours.  Invalid input(s): FREET3   Coagulation profile No results for input(s): INR, PROTIME in the last 168 hours. ------------------------------------------------------------------------------------------------------------------- No results for input(s): DDIMER in the last 72 hours. -------------------------------------------------------------------------------------------------------------------  Cardiac Enzymes Recent Labs  Lab 07/30/18 1545  TROPONINI <0.03   ------------------------------------------------------------------------------------------------------------------ Invalid input(s):  POCBNP  ---------------------------------------------------------------------------------------------------------------  Urinalysis No results found for: COLORURINE, APPEARANCEUR, LABSPEC, PHURINE, GLUCOSEU, HGBUR, BILIRUBINUR, KETONESUR, PROTEINUR, UROBILINOGEN, NITRITE, LEUKOCYTESUR   RADIOLOGY: Ct Head Wo Contrast  Result Date: 07/30/2018 CLINICAL DATA:  Multiple episodes of syncope. EXAM: CT HEAD WITHOUT CONTRAST TECHNIQUE: Contiguous axial images were obtained from the base of the skull through the vertex without intravenous contrast. COMPARISON:  None. FINDINGS: Brain: No evidence of acute infarction, hemorrhage, hydrocephalus, extra-axial collection or mass lesion/mass effect. Vascular: No hyperdense vessel or unexpected calcification. Skull: Normal. Negative for fracture or focal lesion. Sinuses/Orbits: No acute finding. Other: None. IMPRESSION: Normal head CT. Electronically Signed   By: Marijo Conception, M.D.   On: 07/30/2018 16:28    EKG: Orders placed or performed during the hospital encounter of 07/30/18  . EKG 12-Lead  . EKG 12-Lead  . ED EKG  . ED EKG    IMPRESSION AND PLAN:  62 year old female patient with history of hyperlipidemia, hypertension presented to the emergency room for multiple episodes of syncope  -Syncope of unknown etiology Cardiology consult Telemetry monitoring Cycle troponin and check echocardiogram Observation under telemetry  -Acute hypokalemia Replace potassium orally and follow potassium level  -Leukocytosis Check chest x-ray to rule out any pneumonia Follow-up WBC count  -DVT prophylaxis subcu Lovenox daily  -Tobacco abuse Tobacco cessation counseled for the patient for 6 minutes Nicotine patch offered  All the records are reviewed and case discussed with ED provider. Management plans discussed with the patient, family and they are in agreement.  CODE STATUS:Full code    TOTAL TIME TAKING CARE OF THIS PATIENT: 55 minutes.     Saundra Shelling M.D on 07/30/2018 at 5:50 PM  Between 7am to 6pm - Pager - [phone]  After 6pm go to [email or web address] - password [email or web address] [facility]  Office  [phone]  CC: Primary care physician; Lavonne Chick, MD

## 2018-07-30 NOTE — ED Provider Notes (Signed)
[facility] Emergency Department Provider Note   ____________________________________________   First MD Initiated Contact with Patient 07/30/18 1535     (approximate)  I have reviewed the triage vital signs and the nursing notes.   HISTORY  Chief Complaint Loss of Consciousness   HPI Debbie Henderson is a 62 y.o. female he was sent here from her doctor's office.  She is been passing out about 3 times a week for several weeks.  She wake up on the floor does not know why.  She had a pacemaker placed in April of this year.  Pacemaker was interrogated and was reported to be normal sinus rhythm with no arrhythmias.  Patient denies any chest pain headaches injuries from the fall.  She says she has a little bit of neck ache this started after she was in the ambulance laying with her neck funny.  Very mild  Past Medical History:  Diagnosis Date  . Depression   . Elevated cholesterol   . Hypertension   . Thyroid disease     Patient Active Problem List   Diagnosis Date Noted  . Tobacco use disorder 07/21/2018  . Major depressive disorder, recurrent episode with anxious distress ([place]) 04/04/2017  . Hypothyroid 04/04/2017    Past Surgical History:  Procedure Laterality Date  . PACEMAKER IMPLANT      Prior to Admission medications   Medication Sig Start Date End Date Taking? Authorizing Provider  hydrochlorothiazide (HYDRODIURIL) 25 MG tablet Take 25 mg by mouth daily.    [provider]  LOSARTAN POTASSIUM PO Take 1 tablet by mouth daily at 6 (six) AM.    [provider]  risperiDONE (RISPERDAL) 2 MG tablet Take 1 tablet (2 mg total) by mouth at bedtime. 07/21/18   Pucilowska, Wardell Honour, MD  SYNTHROID 125 MCG tablet Take 125 mcg by mouth daily before breakfast.    [provider]  venlafaxine XR (EFFEXOR XR) 75 MG 24 hr capsule Take 2 capsules (150 mg total) by mouth daily. 07/21/18 07/21/19  Clovis Fredrickson, MD     Allergies Percocet [oxycodone-acetaminophen] and Vicodin [hydrocodone-acetaminophen]  No family history on file.  Social History Social History   Tobacco Use  . Smoking status: Current Every Day Smoker  . Smokeless tobacco: Never Used  Substance Use Topics  . Alcohol use: Not Currently  . Drug use: Not on file    Review of Systems  Constitutional: No fever/chills Eyes: No visual changes. ENT: No sore throat. Cardiovascular: Denies chest pain. Respiratory: Denies shortness of breath. Gastrointestinal: No abdominal pain.  No nausea, no vomiting.  No diarrhea.  No constipation. Genitourinary: Negative for dysuria. Musculoskeletal: Negative for back pain. Skin: Negative for rash. Neurological: Negative for focal weakness  ____________________________________________   PHYSICAL EXAM:  VITAL SIGNS: ED Triage Vitals  Enc Vitals Group     BP 07/30/18 1534 110/69     Pulse Rate 07/30/18 1534 69     Resp 07/30/18 1534 16     Temp 07/30/18 1534 97.7 F (36.5 C)     Temp Source 07/30/18 1534 Oral     SpO2 07/30/18 1534 99 %     Weight 07/30/18 1532 168 lb (76.2 kg)     Height 07/30/18 1532 5\' 7"  (1.702 m)     Head Circumference --      Peak Flow --      Pain Score 07/30/18 1532 3     Pain Loc --      Pain  Edu? --      Excl. in [place]? --     Constitutional: Alert and oriented. Well appearing and in no acute distress. Eyes: Conjunctivae are normal. PERRL. EOMI. Head: Atraumatic. Nose: No congestion/rhinnorhea. Mouth/Throat: Mucous membranes are moist.  Oropharynx non-erythematous. Neck: No stridor. Cardiovascular: Normal rate, regular rhythm. Grossly normal heart sounds.  Good peripheral circulation. Respiratory: Normal respiratory effort.  No retractions. Lungs CTAB. Gastrointestinal: Soft and nontender. No distention. No abdominal bruits. No CVA tenderness. Musculoskeletal: No lower extremity tenderness nor edema.  . Neurologic:  Normal speech and language. No  gross focal neurologic deficits are appreciated.  Renal nerves II through XII are intact although visual fields were not checked cerebellar finger-to-nose is normal motor is 5/5 throughout Skin:  Skin is warm, dry and intact. No rash noted. Psychiatric: Mood and affect are normal. Speech and behavior are normal.  ____________________________________________   LABS (all labs ordered are listed, but only abnormal results are displayed)  Labs Reviewed  COMPREHENSIVE METABOLIC PANEL - Abnormal; Notable for the following components:      Result Value   Potassium 3.0 (*)    BUN 42 (*)    Creatinine, Ser 1.54 (*)    GFR calc non Af Amer 35 (*)    GFR calc Af Amer 40 (*)    All other components within normal limits  CBC WITH DIFFERENTIAL/PLATELET - Abnormal; Notable for the following components:   WBC 14.4 (*)    Neutro Abs 9.5 (*)    Lymphs Abs 3.8 (*)    Monocytes Absolute 1.0 (*)    All other components within normal limits  TROPONIN I   ____________________________________________  EKG  EKG read and interpreted by me shows normal sinus rhythm rate of 66 normal axis no acute ST-T wave changes ____________________________________________  RADIOLOGY  ED MD interpretation: Head CT read by radiology is negative.  Official radiology report(s): Ct Head Wo Contrast  Result Date: 07/30/2018 CLINICAL DATA:  Multiple episodes of syncope. EXAM: CT HEAD WITHOUT CONTRAST TECHNIQUE: Contiguous axial images were obtained from the base of the skull through the vertex without intravenous contrast. COMPARISON:  None. FINDINGS: Brain: No evidence of acute infarction, hemorrhage, hydrocephalus, extra-axial collection or mass lesion/mass effect. Vascular: No hyperdense vessel or unexpected calcification. Skull: Normal. Negative for fracture or focal lesion. Sinuses/Orbits: No acute finding. Other: None. IMPRESSION: Normal head CT. Electronically Signed   By: Marijo Conception, M.D.   On: 07/30/2018 16:28     ____________________________________________   PROCEDURES  Procedure(s) performed:   Procedures  Critical Care performed:   ____________________________________________   INITIAL IMPRESSION / ASSESSMENT AND PLAN / ED COURSE  Patient with the functioning pacemaker who is having repeated episodes of syncope and collapse uncertain as to the etiology of disease.  Not sound like seizures this patient does not ever seem to be postictal.  Will discuss with hospitalist and admitting her to further work her up.         ____________________________________________   FINAL CLINICAL IMPRESSION(S) / ED DIAGNOSES  Final diagnoses:  Syncope and collapse     ED Discharge Orders    None       Note:  This document was prepared using Dragon voice recognition software and may include unintentional dictation errors.    Nena Polio, MD 07/30/18 [phone]

## 2018-07-30 NOTE — ED Notes (Signed)
This RN interrogated pt pacemaker. Found in chart that it is [place] pacemaker. Informed pt of brand of pacemaker for future reference.

## 2018-07-31 ENCOUNTER — Observation Stay: Payer: Medicaid Other

## 2018-07-31 LAB — TROPONIN I: Troponin I: <0.03 ng/mL (ref <0.03)

## 2018-07-31 LAB — BASIC METABOLIC PANEL
ANION GAP: 11 (ref 5–15)
BUN: 42 mg/dL — ABNORMAL HIGH (ref 8–23)
CALCIUM: 9.2 mg/dL (ref 8.9–10.3)
CO2: 26 mmol/L (ref 22–32)
Chloride: 105 mmol/L (ref 98–111)
Creatinine, Ser: 1.26 mg/dL — ABNORMAL HIGH (ref 0.44–1.00)
GFR calc non Af Amer: 44 mL/min — ABNORMAL LOW (ref >60)
GFR, EST AFRICAN AMERICAN: 51 mL/min — AB (ref >60)
Glucose, Bld: 91 mg/dL (ref 70–99)
Potassium: 3.1 mmol/L — ABNORMAL LOW (ref 3.5–5.1)
Sodium: 142 mmol/L (ref 135–145)

## 2018-07-31 LAB — GLUCOSE, CAPILLARY: Glucose-Capillary: 91 mg/dL (ref 70–99)

## 2018-07-31 LAB — CORTISOL: Cortisol, Plasma: 14.6 ug/dL

## 2018-07-31 LAB — HIV ANTIBODY (ROUTINE TESTING W REFLEX): HIV Screen 4th Generation wRfx: NONREACTIVE

## 2018-07-31 LAB — MAGNESIUM: Magnesium: 2.2 mg/dL (ref 1.7–2.4)

## 2018-07-31 MED ORDER — SODIUM CHLORIDE 0.9 % IV BOLUS
1000.0000 mL | Freq: Once | INTRAVENOUS | Status: AC
  Administered 2018-07-31: 1000 mL via INTRAVENOUS

## 2018-07-31 MED ORDER — ENSURE ENLIVE PO LIQD
237.0000 mL | Freq: Two times a day (BID) | ORAL | Status: DC
  Administered 2018-08-01 (×2): 237 mL via ORAL

## 2018-07-31 MED ORDER — IOPAMIDOL (ISOVUE-300) INJECTION 61%
75.0000 mL | Freq: Once | INTRAVENOUS | Status: AC | PRN
  Administered 2018-07-31: 75 mL via INTRAVENOUS

## 2018-07-31 MED ORDER — POTASSIUM CHLORIDE CRYS ER 20 MEQ PO TBCR
40.0000 meq | EXTENDED_RELEASE_TABLET | Freq: Once | ORAL | Status: AC
  Administered 2018-07-31: 40 meq via ORAL
  Filled 2018-07-31: qty 2

## 2018-07-31 MED ORDER — POTASSIUM CHLORIDE IN NACL 40-0.9 MEQ/L-% IV SOLN
INTRAVENOUS | Status: AC
  Administered 2018-07-31: 100 mL/h via INTRAVENOUS
  Filled 2018-07-31: qty 1000

## 2018-07-31 MED ORDER — IOPAMIDOL (ISOVUE-300) INJECTION 61%: 100.0000 mL | Freq: Once | INTRAVENOUS | Status: DC | PRN

## 2018-07-31 MED ORDER — ADULT MULTIVITAMIN W/MINERALS CH
1.0000 | ORAL_TABLET | Freq: Every day | ORAL | Status: DC
  Administered 2018-07-31 – 2018-08-02 (×3): 1 via ORAL
  Filled 2018-07-31 (×3): qty 1

## 2018-07-31 MED ORDER — IOPAMIDOL (ISOVUE-300) INJECTION 61%
15.0000 mL | INTRAVENOUS | Status: AC
  Administered 2018-07-31 (×2): 15 mL via ORAL

## 2018-07-31 NOTE — Progress Notes (Signed)
Initial Nutrition Assessment  DOCUMENTATION CODES:   Not applicable  INTERVENTION:   Ensure Enlive po BID, each supplement provides 350 kcal and 20 grams of protein  MVI daily  Liberalize diet  NUTRITION DIAGNOSIS:   Unintentional weight loss related to poor appetite as evidenced by 13 percent weight loss in 7 months.  GOAL:   Patient will meet greater than or equal to 90% of their needs  MONITOR:   PO intake, Supplement acceptance, Labs, Weight trends, Skin, I & O's  REASON FOR ASSESSMENT:   Malnutrition Screening Tool    ASSESSMENT:   62 year old female patient with history of hyperlipidemia, hypertension, recent pacemaker presented to the emergency room for multiple episodes of syncope   Met with pt in room today. Pt reports poor appetite and oral intake at baseline. Pt does not drink any supplements at home. Pt reports eaitng fair at breakfast this morning; believes she ate at least half of her food. Per chart, pt has lost 35lbs(13%) in 7 months; this is significant weight loss. Pt confirms weight loss is unintentional. RD will order supplements and MVI to help pt meet her estimated needs. Pt is willing to try chocolate Ensure. RD will liberalize diet to encourage po intake as pt is not likely eating enough to exceed nutrient limits. Suspect pt is likely at low to moderate refeeding risk; would recommend monitor K, Mg and P labs when oral intake improves.   Medications reviewed and include: lovenox, synthroid  Labs reviewed: K 3.1(L), Mg 2.2 wnl Wbc- 14.4(H)- 8/15  NUTRITION - FOCUSED PHYSICAL EXAM:    Most Recent Value  Orbital Region  No depletion  Upper Arm Region  No depletion  Thoracic and Lumbar Region  No depletion  Buccal Region  No depletion  Temple Region  No depletion  Clavicle Bone Region  No depletion  Clavicle and Acromion Bone Region  No depletion  Scapular Bone Region  No depletion  Dorsal Hand  Mild depletion  Patellar Region  Moderate  depletion  Anterior Thigh Region  Mild depletion  Posterior Calf Region  Mild depletion  Edema (RD Assessment)  None  Hair  Reviewed  Eyes  Reviewed  Mouth  Reviewed  Skin  Reviewed  Nails  Reviewed     Diet Order:   Diet Order            Diet Heart Room service appropriate? Yes; Fluid consistency: Thin  Diet effective now             EDUCATION NEEDS:   Education needs have been addressed  Skin:  Skin Assessment: Reviewed RN Assessment  Last BM:  8/14  Height:   Ht Readings from Last 1 Encounters:  07/30/18 5' 7" (1.702 m)    Weight:   Wt Readings from Last 1 Encounters:  07/31/18 76.1 kg    Ideal Body Weight:  61.36 kg  BMI:  Body mass index is 26.28 kg/m.  Estimated Nutritional Needs:   Kcal:  1600-1800kcal/day   Protein:  76-84g/day   Fluid:  >1.6L/day   Koleen Distance MS, RD, LDN Pager #- [phone] Office#- [phone] After Hours Pager: [phone]

## 2018-07-31 NOTE — Progress Notes (Addendum)
Protection at [facility]                                                                                                                                                                                  Patient Demographics   Debbie Henderson, is a 62 y.o. female, DOB - 06-26-1956, YNW:[id number]  Admit date - 07/30/2018   Admitting Physician Saundra Shelling, MD  Outpatient Primary MD for the patient is Lavonne Chick, MD   LOS - 0  Subjective: Was admitted with syncopal episodes.  Noted to have orthostasis and hypokalemia. Patient also endorses significant weight loss 60 pounds over the past few months.   Review of Systems:   CONSTITUTIONAL: No documented fever. No fatigue, weakness. No weight gain, positive weight loss.  EYES: No blurry or double vision.  ENT: No tinnitus. No postnasal drip. No redness of the oropharynx.  RESPIRATORY: No cough, no wheeze, no hemoptysis. No dyspnea.  CARDIOVASCULAR: No chest pain. No orthopnea. No palpitations. No syncope.  GASTROINTESTINAL: No nausea, no vomiting or diarrhea. No abdominal pain. No melena or hematochezia.  GENITOURINARY: No dysuria or hematuria.  ENDOCRINE: No polyuria or nocturia. No heat or cold intolerance.  HEMATOLOGY: No anemia. No bruising. No bleeding.  INTEGUMENTARY: No rashes. No lesions.  MUSCULOSKELETAL: No arthritis. No swelling. No gout.  NEUROLOGIC: No numbness, tingling, or ataxia. No seizure-type activity.  PSYCHIATRIC: No anxiety. No insomnia. No ADD.    Vitals:   Vitals:   07/31/18 0058 07/31/18 0453 07/31/18 0500 07/31/18 0748  BP: (!) 101/51 (!) 110/54  109/69  Pulse: 66 79  78  Resp: 18 18  18   Temp: 98.2 F (36.8 C) 97.6 F (36.4 C)  97.8 F (36.6 C)  TempSrc: Oral Oral  Oral  SpO2: 96% 97%  97%  Weight:   76.1 kg   Height:        Wt Readings from Last 3 Encounters:  07/31/18 76.1 kg  07/21/18 90.7 kg  04/04/17 90.7 kg     Intake/Output Summary (Last 24 hours) at  07/31/2018 1254 Last data filed at 07/31/2018 1154 Gross per 24 hour  Intake 1000.4 ml  Output 500 ml  Net 500.4 ml    Physical Exam:   GENERAL: Pleasant-appearing in no apparent distress.  HEAD, EYES, EARS, NOSE AND THROAT: Atraumatic, normocephalic. Extraocular muscles are intact. Pupils equal and reactive to light. Sclerae anicteric. No conjunctival injection. No oro-pharyngeal erythema.  NECK: Supple. There is no jugular venous distention. No bruits, no lymphadenopathy, no thyromegaly.  HEART: Regular rate and rhythm,. No murmurs, no rubs, no clicks.  LUNGS: Clear to auscultation bilaterally. No rales or rhonchi. No wheezes.  ABDOMEN: Soft, flat, nontender, nondistended. Has good bowel sounds. No hepatosplenomegaly appreciated.  EXTREMITIES: No evidence of any cyanosis, clubbing, or peripheral edema.  +2 pedal and radial pulses bilaterally.  NEUROLOGIC: The patient is alert, awake, and oriented x3 with no focal motor or sensory deficits appreciated bilaterally.  SKIN: Moist and warm with no rashes appreciated.  Psych: Not anxious, depressed LN: No inguinal LN enlargement    Antibiotics   Anti-infectives (From admission, onward)   None      Medications   Scheduled Meds: . enoxaparin (LOVENOX) injection  40 mg Subcutaneous Q24H  . feeding supplement (ENSURE ENLIVE)  237 mL Oral BID BM  . levothyroxine  125 mcg Oral Q0600  . multivitamin with minerals  1 tablet Oral Daily  . risperiDONE  2 mg Oral QHS  . sodium chloride flush  3 mL Intravenous Q12H  . sodium chloride flush  3 mL Intravenous Q12H  . venlafaxine XR  150 mg Oral Daily   Continuous Infusions: . sodium chloride    . 0.9 % NaCl with KCl 40 mEq / L     PRN Meds:.sodium chloride, acetaminophen **OR** acetaminophen, ondansetron **OR** ondansetron (ZOFRAN) IV, senna-docusate, sodium chloride flush   Data Review:   Micro Results No results found for this or any previous visit (from the past 240  hour(s)).  Radiology Reports Ct Head Wo Contrast  Result Date: 07/30/2018 CLINICAL DATA:  Multiple episodes of syncope. EXAM: CT HEAD WITHOUT CONTRAST TECHNIQUE: Contiguous axial images were obtained from the base of the skull through the vertex without intravenous contrast. COMPARISON:  None. FINDINGS: Brain: No evidence of acute infarction, hemorrhage, hydrocephalus, extra-axial collection or mass lesion/mass effect. Vascular: No hyperdense vessel or unexpected calcification. Skull: Normal. Negative for fracture or focal lesion. Sinuses/Orbits: No acute finding. Other: None. IMPRESSION: Normal head CT. Electronically Signed   By: Marijo Conception, M.D.   On: 07/30/2018 16:28   Dg Chest Portable 1 View  Result Date: 07/30/2018 CLINICAL DATA:  Syncope EXAM: PORTABLE CHEST 1 VIEW COMPARISON:  None. FINDINGS: Left pacer in place with leads in the right atrium and right ventricle. Heart is normal size. Lungs clear. No effusions or acute bony abnormality. IMPRESSION: No active disease. Electronically Signed   By: Rolm Baptise M.D.   On: 07/30/2018 17:58     CBC Recent Labs  Lab 07/30/18 1545  WBC 14.4*  HGB 14.8  HCT 43.0  PLT 296  MCV 93.9  MCH 32.4  MCHC 34.5  RDW 12.4  LYMPHSABS 3.8*  MONOABS 1.0*  EOSABS 0.0  BASOSABS 0.0    Chemistries  Recent Labs  Lab 07/30/18 1545 07/31/18 0632  NA 141 142  K 3.0* 3.1*  CL 103 105  CO2 24 26  GLUCOSE 89 91  BUN 42* 42*  CREATININE 1.54* 1.26*  CALCIUM 9.8 9.2  MG  --  2.2  AST 19  --   ALT 16  --   ALKPHOS 74  --   BILITOT 1.0  --    ------------------------------------------------------------------------------------------------------------------ estimated creatinine clearance is 48.6 mL/min (A) (by C-G formula based on SCr of 1.26 mg/dL (H)). ------------------------------------------------------------------------------------------------------------------ No results for input(s): HGBA1C in the last 72  hours. ------------------------------------------------------------------------------------------------------------------ No results for input(s): CHOL, HDL, LDLCALC, TRIG, CHOLHDL, LDLDIRECT in the last 72 hours. ------------------------------------------------------------------------------------------------------------------ No results for input(s): TSH, T4TOTAL, T3FREE, THYROIDAB in the last 72 hours.  Invalid input(s): FREET3 ------------------------------------------------------------------------------------------------------------------ No results for input(s): VITAMINB12, FOLATE, FERRITIN, TIBC, IRON, RETICCTPCT in the last 72 hours.  Coagulation profile No results for input(s): INR, PROTIME in the last 168 hours.  No results for input(s): DDIMER in the last 72 hours.  Cardiac Enzymes Recent Labs  Lab 07/30/18 1904 07/31/18 0043 07/31/18 0632  TROPONINI <0.03 <0.03 <0.03   ------------------------------------------------------------------------------------------------------------------ Invalid input(s): POCBNP    Assessment & Plan   62 year old female patient with history of hyperlipidemia, hypertension presented to the emergency room for multiple episodes of syncope  -Syncope due to orthostatic hypotension Resume IV fluids Check a cortisol level Patient had a echo last year showed no significant abnormalities no need to repeat  -Acute hypokalemia Continue to replace potassium  -Weight loss will obtain a CT scan of the abdomen and pelvis and chest to rule out occult malignancy  -Acute kidney injury improved with IV hydration  -Leukocytosis Probably reactive check a CBC in the morning  -Tobacco abuse Tobacco cessation provided    Code Status Orders  (From admission, onward)         Start     Ordered   07/30/18 1841  Full code  Continuous     07/30/18 1840        Code Status History    This patient has a current code status but no historical code  status.           Consults none   DVT Prophylaxis  Lovenox  Lab Results  Component Value Date   PLT 296 07/30/2018     Time Spent in minutes 35 minutes greater than 50% of time spent in care coordination and counseling patient regarding the condition and plan of care.   Dustin Flock M.D on 07/31/2018 at 12:54 PM  Between 7am to 6pm - Pager - [phone]  After 6pm go to [email or web address] - Proofreader  [employer]   Office  [phone]

## 2018-08-01 LAB — BASIC METABOLIC PANEL
ANION GAP: 8 (ref 5–15)
BUN: 23 mg/dL (ref 8–23)
CALCIUM: 9.2 mg/dL (ref 8.9–10.3)
CO2: 25 mmol/L (ref 22–32)
CREATININE: 0.9 mg/dL (ref 0.44–1.00)
Chloride: 107 mmol/L (ref 98–111)
Glucose, Bld: 87 mg/dL (ref 70–99)
Potassium: 3.3 mmol/L — ABNORMAL LOW (ref 3.5–5.1)
Sodium: 140 mmol/L (ref 135–145)

## 2018-08-01 LAB — GLUCOSE, CAPILLARY: Glucose-Capillary: 90 mg/dL (ref 70–99)

## 2018-08-01 MED ORDER — POTASSIUM CHLORIDE CRYS ER 20 MEQ PO TBCR
40.0000 meq | EXTENDED_RELEASE_TABLET | Freq: Once | ORAL | Status: AC
  Administered 2018-08-01: 40 meq via ORAL
  Filled 2018-08-01: qty 2

## 2018-08-01 NOTE — Evaluation (Addendum)
Physical Therapy Evaluation Patient Details Name: Debbie Henderson MRN: [id number] DOB: 1956/03/10 Today's Date: 08/01/2018   History of Present Illness  Debbie Henderson is a 62yo female who [facility] on 8[facility]15 d/t severeal syncopal episodes recently. PMH: recurrent syncope. HTN, HLD, thyroid disease, s/p PPM. Pt also reports 60lb weight loss in the past severl months. Pt orthostatic upon arrival, also hypokalemic (now resolved).   Clinical Impression         Vitals Taken in Session: Pt admitted with above diagnosis. Pt currently with functional limitations due to the deficits listed below (see "PT Problem List"). Upon entry, pt in bed, no family/caregiver present. The pt is awake and agreeable to participate. Moving well in general throughout session, low effort required, and no additional time required, but pt endorses weakness, which is not surprising given several weeks of self limited physical activity d/t fears of passing out in public or in yard. Orthostatic vitals and tachycardia to 150s limits additional AMB testing d/t safety concerns, although patient is only mildly symptomatic while standing for BP assessment and during performance of 10 reps of chair squats. Pt will benefit from HHPT at DC to address several weeks of deconditioning and help safely transition back to ad lib physical activity. Pt will benefit from skilled PT intervention to increase independence and safety with basic mobility in preparation for discharge to the venue listed below.         Follow Up Recommendations Home health PT;Supervision for mobility/OOB    Equipment Recommendations  None recommended by PT    Recommendations for Other Services       Precautions / Restrictions Precautions Precautions: Fall Restrictions Weight Bearing Restrictions: No      Mobility  Bed Mobility Overal bed mobility: Independent                Transfers Overall transfer level: Independent                General transfer comment: performs 10x STS hands free, no LOB, but fatigue and effort are apparent in last half d/t decreases movement velocity.   Ambulation/Gait Ambulation/Gait assistance: (deferred at this time d/t continued hypotension/tachycardia. )              Stairs            Wheelchair Mobility    Modified Rankin (Stroke Patients Only)       Balance Overall balance assessment: History of Falls;Modified Independent                                           Pertinent Vitals/Pain Pain Assessment: No/denies pain    Home Living Family/patient expects to be discharged to:: Private residence Living Arrangements: Spouse/significant other;Children Available Help at Discharge: Family;Available 24 hours/day(boyfirned of 15y, and 24yo step daughter. ) Type of Home: House Home Access: Stairs to enter Entrance Stairs-Rails: None Entrance Stairs-Number of Steps: 3 Home Layout: One level Home Equipment: None      Prior Function Level of Independence: Independent         Comments: retired/disabled; independent c ADL; previously fully independent in community qaccess, but recently self limiting to avoid syncope.      Hand Dominance   Dominant Hand: Right    Extremity/Trunk Assessment                Communication   Communication: No difficulties  Cognition Arousal/Alertness: Awake/alert Behavior During Therapy: WFL for tasks assessed/performed Overall Cognitive Status: Within Functional Limits for tasks assessed                                        General Comments      Exercises     Assessment/Plan    PT Assessment Patient needs continued PT services  PT Problem List Decreased strength;Decreased activity tolerance;Decreased balance;Decreased mobility       PT Treatment Interventions Balance training;Functional mobility training;Therapeutic activities;Therapeutic exercise;Patient/family  education    PT Goals (Current goals can be found in the Care Plan section)  Acute Rehab PT Goals Patient Stated Goal: stop passingout PT Goal Formulation: With patient Time For Goal Achievement: 08/15/18 Potential to Achieve Goals: Fair    Frequency Min 2X/week   Barriers to discharge        Co-evaluation               AM-PAC PT "6 Clicks" Daily Activity  Outcome Measure Difficulty turning over in bed (including adjusting bedclothes, sheets and blankets)?: None Difficulty moving from lying on back to sitting on the side of the bed? : None Difficulty sitting down on and standing up from a chair with arms (e.g., wheelchair, bedside commode, etc,.)?: None Help needed moving to and from a bed to chair (including a wheelchair)?: None Help needed walking in hospital room?: None Help needed climbing 3-5 steps with a railing? : A Little 6 Click Score: 23    End of Session Equipment Utilized During Treatment: Gait belt Activity Tolerance: Treatment limited secondary to medical complications (Comment) Patient left: in chair;with chair alarm set;with call bell/phone within reach Nurse Communication: Mobility status PT Visit Diagnosis: Muscle weakness (generalized) (M62.81);Difficulty in walking, not elsewhere classified (R26.2);Other abnormalities of gait and mobility (R26.89)    Time: 4270-6237 PT Time Calculation (min) (ACUTE ONLY): 23 min   Charges:   PT Evaluation $PT Eval Low Complexity: 1 Low PT Treatments $Therapeutic Activity: 8-22 mins        1:47 PM, 08/01/18 Debbie Henderson, PT, DPT Physical Therapist - [facility]  [phone] ([place])    Debbie Henderson C 08/01/2018, 1:43 PM

## 2018-08-01 NOTE — Progress Notes (Signed)
Orthostatic vitals established within PT evaluation session.     08/01/18 1328  Therapy Vitals  Patient Position (if appropriate) Orthostatic Vitals  Orthostatic Lying   BP- Lying 131/73  Pulse- Lying 115  Orthostatic Sitting at 0 minutes  BP- Sitting at 0 minutes 106/83  Pulse- Sitting at 0 minutes 123  Orthostatic Sitting at 3 minutes   BP- Sitting at 3 minutes 126/66  Pulse- Sitting at 3 minutes 123  Orthostatic Standing at 0 minutes  BP- Standing at 0 minutes 93/58  Pulse- Standing at 0 minutes 143  Orthostatic Standing at 3 minutes  BP- Standing at 3 minutes 100/66  Pulse- Standing at 3 minutes 150  Oxygen Therapy  O2 Device Room Air   1:36 PM, 08/01/18 Etta Grandchild, PT, DPT Physical Therapist - [facility]  [phone] [facility])

## 2018-08-01 NOTE — Progress Notes (Signed)
Per Dr. Vianne Bulls, order 40 mEq PO potassium once.

## 2018-08-01 NOTE — Progress Notes (Signed)
[place] at [facility]                                                                                                                                                                                  Patient Demographics   Debbie Henderson, is a 62 y.o. female, DOB - 1956-05-24, YIF:[id number]  Admit date - 07/30/2018   Admitting Physician Saundra Shelling, MD  Outpatient Primary MD for the patient is Lavonne Chick, MD   LOS - 0  Subjective: Was admitted with syncopal episodes.  Noted to have orthostasis and hypokalemia. Patient also endorses significant weight loss 60 pounds over the past few months. CT chest, abdomen and pelvis essentially within normal range except coronary artery disease.  Patient says that she is feeling better today.  Denies any chest pain or shortness of breath.  Review of Systems:   CONSTITUTIONAL: No documented fever. No fatigue, weakness. No weight gain, positive weight loss.  EYES: No blurry or double vision.  ENT: No tinnitus. No postnasal drip. No redness of the oropharynx.  RESPIRATORY: No cough, no wheeze, no hemoptysis. No dyspnea.  CARDIOVASCULAR: No chest pain. No orthopnea. No palpitations. No syncope.  GASTROINTESTINAL: No nausea, no vomiting or diarrhea. No abdominal pain. No melena or hematochezia.  GENITOURINARY: No dysuria or hematuria.  ENDOCRINE: No polyuria or nocturia. No heat or cold intolerance.  HEMATOLOGY: No anemia. No bruising. No bleeding.  INTEGUMENTARY: No rashes. No lesions.  MUSCULOSKELETAL: No arthritis. No swelling. No gout.  NEUROLOGIC: No numbness, tingling, or ataxia. No seizure-type activity.  PSYCHIATRIC: No anxiety. No insomnia. No ADD.    Vitals:   Vitals:   07/31/18 0748 07/31/18 1714 07/31/18 2041 08/01/18 0456  BP: 109/69 140/73 120/62 (!) 105/41  Pulse: 78 (!) 109 85 82  Resp: 18 18 16 18   Temp: 97.8 F (36.6 C) 98.4 F (36.9 C) 98.1 F (36.7 C) 98.4 F (36.9 C)  TempSrc: Oral  Oral Oral Oral  SpO2: 97% 96% 96% 100%  Weight:    77.1 kg  Height:        Wt Readings from Last 3 Encounters:  08/01/18 77.1 kg  07/21/18 90.7 kg  04/04/17 90.7 kg     Intake/Output Summary (Last 24 hours) at 08/01/2018 0736 Last data filed at 08/01/2018 0456 Gross per 24 hour  Intake 1498.73 ml  Output 1450 ml  Net 48.73 ml    Physical Exam:   GENERAL: Pleasant-appearing in no apparent distress.  HEAD, EYES, EARS, NOSE AND THROAT: Atraumatic, normocephalic. Extraocular muscles are intact. Pupils equal and reactive to light. Sclerae anicteric. No conjunctival injection. No oro-pharyngeal erythema.  NECK: Supple. There is no  jugular venous distention. No bruits, no lymphadenopathy, no thyromegaly.  HEART: Regular rate and rhythm,. No murmurs, no rubs, no clicks.  LUNGS: Clear to auscultation bilaterally. No rales or rhonchi. No wheezes.  ABDOMEN: Soft, flat, nontender, nondistended. Has good bowel sounds. No hepatosplenomegaly appreciated.  EXTREMITIES: No evidence of any cyanosis, clubbing, or peripheral edema.  +2 pedal and radial pulses bilaterally.  NEUROLOGIC: The patient is alert, awake, and oriented x3 with no focal motor or sensory deficits appreciated bilaterally.  SKIN: Moist and warm with no rashes appreciated.  Psych: Not anxious, depressed LN: No inguinal LN enlargement    Antibiotics   Anti-infectives (From admission, onward)   None      Medications   Scheduled Meds: . enoxaparin (LOVENOX) injection  40 mg Subcutaneous Q24H  . feeding supplement (ENSURE ENLIVE)  237 mL Oral BID BM  . levothyroxine  125 mcg Oral Q0600  . multivitamin with minerals  1 tablet Oral Daily  . risperiDONE  2 mg Oral QHS  . sodium chloride flush  3 mL Intravenous Q12H  . sodium chloride flush  3 mL Intravenous Q12H  . venlafaxine XR  150 mg Oral Daily   Continuous Infusions: . sodium chloride     PRN Meds:.sodium chloride, acetaminophen **OR** acetaminophen, ondansetron  **OR** ondansetron (ZOFRAN) IV, senna-docusate, sodium chloride flush   Data Review:   Micro Results No results found for this or any previous visit (from the past 240 hour(s)).  Radiology Reports Ct Head Wo Contrast  Result Date: 07/30/2018 CLINICAL DATA:  Multiple episodes of syncope. EXAM: CT HEAD WITHOUT CONTRAST TECHNIQUE: Contiguous axial images were obtained from the base of the skull through the vertex without intravenous contrast. COMPARISON:  None. FINDINGS: Brain: No evidence of acute infarction, hemorrhage, hydrocephalus, extra-axial collection or mass lesion/mass effect. Vascular: No hyperdense vessel or unexpected calcification. Skull: Normal. Negative for fracture or focal lesion. Sinuses/Orbits: No acute finding. Other: None. IMPRESSION: Normal head CT. Electronically Signed   By: Marijo Conception, M.D.   On: 07/30/2018 16:28   Ct Chest W Contrast  Result Date: 07/31/2018 CLINICAL DATA:  Unintended weight loss, generalized abdominal pain. EXAM: CT CHEST, ABDOMEN, AND PELVIS WITH CONTRAST TECHNIQUE: Multidetector CT imaging of the chest, abdomen and pelvis was performed following the standard protocol during bolus administration of intravenous contrast. CONTRAST:  22mL ISOVUE-300 IOPAMIDOL (ISOVUE-300) INJECTION 61% COMPARISON:  None. FINDINGS: CT CHEST FINDINGS Cardiovascular: Atherosclerosis of thoracic aorta is noted without aneurysm or dissection. Coronary artery calcifications are noted. Normal cardiac size. No pericardial effusion. Left-sided pacemaker is noted with leads in grossly good position. Mediastinum/Nodes: No enlarged mediastinal, hilar, or axillary lymph nodes. Thyroid gland, trachea, and esophagus demonstrate no significant findings. Lungs/Pleura: Lungs are clear. No pleural effusion or pneumothorax. Musculoskeletal: No chest wall mass or suspicious bone lesions identified. CT ABDOMEN PELVIS FINDINGS Hepatobiliary: No focal liver abnormality is seen. No gallstones,  gallbladder wall thickening, or biliary dilatation. Pancreas: Unremarkable. No pancreatic ductal dilatation or surrounding inflammatory changes. Spleen: Normal in size without focal abnormality. Adrenals/Urinary Tract: Adrenal glands are unremarkable. Kidneys are normal, without renal calculi, focal lesion, or hydronephrosis. Bladder is unremarkable. Stomach/Bowel: Stomach is within normal limits. Appendix appears normal. No evidence of bowel wall thickening, distention, or inflammatory changes. Vascular/Lymphatic: Aortic atherosclerosis. No enlarged abdominal or pelvic lymph nodes. Reproductive: Status post hysterectomy. No adnexal masses. Other: No abdominal wall hernia or abnormality. No abdominopelvic ascites. Musculoskeletal: No acute or significant osseous findings. IMPRESSION: Coronary artery calcifications are noted suggesting coronary artery  disease. No acute abnormality seen in the chest, abdomen or pelvis. Aortic Atherosclerosis (ICD10-I70.0). Electronically Signed   By: Marijo Conception, M.D.   On: 07/31/2018 15:01   Ct Abdomen Pelvis W Contrast  Result Date: 07/31/2018 CLINICAL DATA:  Unintended weight loss, generalized abdominal pain. EXAM: CT CHEST, ABDOMEN, AND PELVIS WITH CONTRAST TECHNIQUE: Multidetector CT imaging of the chest, abdomen and pelvis was performed following the standard protocol during bolus administration of intravenous contrast. CONTRAST:  83mL ISOVUE-300 IOPAMIDOL (ISOVUE-300) INJECTION 61% COMPARISON:  None. FINDINGS: CT CHEST FINDINGS Cardiovascular: Atherosclerosis of thoracic aorta is noted without aneurysm or dissection. Coronary artery calcifications are noted. Normal cardiac size. No pericardial effusion. Left-sided pacemaker is noted with leads in grossly good position. Mediastinum/Nodes: No enlarged mediastinal, hilar, or axillary lymph nodes. Thyroid gland, trachea, and esophagus demonstrate no significant findings. Lungs/Pleura: Lungs are clear. No pleural effusion or  pneumothorax. Musculoskeletal: No chest wall mass or suspicious bone lesions identified. CT ABDOMEN PELVIS FINDINGS Hepatobiliary: No focal liver abnormality is seen. No gallstones, gallbladder wall thickening, or biliary dilatation. Pancreas: Unremarkable. No pancreatic ductal dilatation or surrounding inflammatory changes. Spleen: Normal in size without focal abnormality. Adrenals/Urinary Tract: Adrenal glands are unremarkable. Kidneys are normal, without renal calculi, focal lesion, or hydronephrosis. Bladder is unremarkable. Stomach/Bowel: Stomach is within normal limits. Appendix appears normal. No evidence of bowel wall thickening, distention, or inflammatory changes. Vascular/Lymphatic: Aortic atherosclerosis. No enlarged abdominal or pelvic lymph nodes. Reproductive: Status post hysterectomy. No adnexal masses. Other: No abdominal wall hernia or abnormality. No abdominopelvic ascites. Musculoskeletal: No acute or significant osseous findings. IMPRESSION: Coronary artery calcifications are noted suggesting coronary artery disease. No acute abnormality seen in the chest, abdomen or pelvis. Aortic Atherosclerosis (ICD10-I70.0). Electronically Signed   By: Marijo Conception, M.D.   On: 07/31/2018 15:01   Dg Chest Portable 1 View  Result Date: 07/30/2018 CLINICAL DATA:  Syncope EXAM: PORTABLE CHEST 1 VIEW COMPARISON:  None. FINDINGS: Left pacer in place with leads in the right atrium and right ventricle. Heart is normal size. Lungs clear. No effusions or acute bony abnormality. IMPRESSION: No active disease. Electronically Signed   By: Rolm Baptise M.D.   On: 07/30/2018 17:58     CBC Recent Labs  Lab 07/30/18 1545  WBC 14.4*  HGB 14.8  HCT 43.0  PLT 296  MCV 93.9  MCH 32.4  MCHC 34.5  RDW 12.4  LYMPHSABS 3.8*  MONOABS 1.0*  EOSABS 0.0  BASOSABS 0.0    Chemistries  Recent Labs  Lab 07/30/18 1545 07/31/18 0632 08/01/18 0442  NA 141 142 140  K 3.0* 3.1* 3.3*  CL 103 105 107  CO2 24 26  25   GLUCOSE 89 91 87  BUN 42* 42* 23  CREATININE 1.54* 1.26* 0.90  CALCIUM 9.8 9.2 9.2  MG  --  2.2  --   AST 19  --   --   ALT 16  --   --   ALKPHOS 74  --   --   BILITOT 1.0  --   --    ------------------------------------------------------------------------------------------------------------------ estimated creatinine clearance is 68.5 mL/min (by C-G formula based on SCr of 0.9 mg/dL). ------------------------------------------------------------------------------------------------------------------ No results for input(s): HGBA1C in the last 72 hours. ------------------------------------------------------------------------------------------------------------------ No results for input(s): CHOL, HDL, LDLCALC, TRIG, CHOLHDL, LDLDIRECT in the last 72 hours. ------------------------------------------------------------------------------------------------------------------ No results for input(s): TSH, T4TOTAL, T3FREE, THYROIDAB in the last 72 hours.  Invalid input(s): FREET3 ------------------------------------------------------------------------------------------------------------------ No results for input(s): VITAMINB12, FOLATE, FERRITIN, TIBC, IRON, RETICCTPCT in  the last 72 hours.  Coagulation profile No results for input(s): INR, PROTIME in the last 168 hours.  No results for input(s): DDIMER in the last 72 hours.  Cardiac Enzymes Recent Labs  Lab 07/30/18 1904 07/31/18 0043 07/31/18 0632  TROPONINI <0.03 <0.03 <0.03   ------------------------------------------------------------------------------------------------------------------ Invalid input(s): POCBNP    Assessment & Plan   62 year old female patient with history of hyperlipidemia, hypertension presented to the emergency room for multiple episodes of syncope  -Syncope due to orthostatic hypotension Resume IV fluids Check a cortisol level Patient had a echo last year showed no significant abnormalities no  need to repeat Syncope symptoms and orthostatic hypotension are improved, check physical therapy evaluation and see how she does and likely discharge home tomorrow if she continues to feel better we will arrange home health physical therapy and nursing for her.  -Acute hypokalemia Continue to replace potassium, potassium is improved but still low, continue to replace.  -Weight loss of unclear origin, CT chest, abdomen, pelvis done yesterday is essentially within normal range.  -Acute kidney injury improved with IV hydration  -Leukocytosis Probably reactive check CBC again. -Tobacco abuse Tobacco cessation provided    Code Status Orders  (From admission, onward)         Start     Ordered   07/30/18 1841  Full code  Continuous     07/30/18 1840        Code Status History    This patient has a current code status but no historical code status.           Consults none   DVT Prophylaxis  Lovenox  Lab Results  Component Value Date   PLT 296 07/30/2018     Time Spent in minutes 35 minutes greater than 50% of time spent in care coordination and counseling patient regarding the condition and plan of care.   Epifanio Lesches M.D on 08/01/2018 at 7:36 AM  Between 7am to 6pm - Pager - [phone]  After 6pm go to [email or web address] - Proofreader  [employer]   Office  [phone]

## 2018-08-02 LAB — GLUCOSE, CAPILLARY: Glucose-Capillary: 94 mg/dL (ref 70–99)

## 2018-08-02 MED ORDER — ENSURE ENLIVE PO LIQD: 237.0000 mL | Freq: Two times a day (BID) | ORAL | 12 refills | Status: AC

## 2018-08-02 NOTE — Care Management Note (Signed)
Case Management Note  Patient Details  Name: Debbie Henderson MRN: [id number] Date of Birth: [birth year]  Subjective/Objective:   Patient to be discharged per MD order. Orders in place for home health services. Given choice patient will be set up with [facility]. Services for PT,RN and aide. Referral placed with [place] who agrees to accept the case. No DME needs. Family to provide transportation home.  Ines Bloomer RN BSN RNCM [phone]                   Action/Plan:   Expected Discharge Date:  08/02/18               Expected Discharge Plan:  [place]  In-House Referral:     Discharge planning Services     Post Acute Care Choice:  Home Health Choice offered to:  Patient  DME Arranged:    DME Agency:     HH Arranged:  RN, PT, Nurse's Aide [place] Agency:  [facility]  Status of Service:  Completed, signed off  If discussed at [place] of Stay Meetings, dates discussed:    Additional Comments:  Laurali Goddard A Shaquanta Harkless, RN 08/02/2018, 10:08 AM

## 2018-08-02 NOTE — Discharge Summary (Signed)
Debbie Henderson, is a 62 y.o. female  DOB [birth year]  MRN [id number].  Admission date:  07/30/2018  Admitting Physician  Saundra Shelling, MD  Discharge Date:  08/02/2018   Primary MD  Lavonne Chick, MD  Recommendations for primary care physician for things to follow:   Discharge home and follow-up with PCP in 1 week   Admission Diagnosis  Syncope and collapse [R55]   Discharge Diagnosis  Syncope and collapse [R55]    Active Problems:   Syncope and collapse      Past Medical History:  Diagnosis Date  . Depression   . Elevated cholesterol   . Hypertension   . Thyroid disease     Past Surgical History:  Procedure Laterality Date  . PACEMAKER IMPLANT         History of present illness and  Hospital Course:     Kindly see H&P for history of present illness and admission details, please review complete Labs, Consult reports and Test reports for all details in brief  HPI  from the history and physical done on the day of admission 62 year old female patient with history of hyperlipidemia, hypertension, thyroid disease came in because of loss of consciousness and syncope.  Patient passed out.  Patient has been to [facility] cardiology 3 years ago with same symptoms, patient has been evaluated, had a pacemaker this April.  Admitted for syncope evaluation.  Hospital Course  #1 syncope,, secondary to orthostatic hypotension.  Received IV fluids, monitor on telemetry.  Patient work-up is essentially normal o including troponins which are negative for 3 times, CT head on admission did not show acute changes, Chest x-ray also did not show acute changes.  Patient found to have severe orthostatic hypotension with blood pressure while sitting 106/83 and then while standing 93/58, patient received IV fluids, she feels much better  and eager to go home today.  Patient had echocardiogram at [facility] in December 2018 right showed ejection fraction 60 to 65% with normal right ventricle systolic function, grade 1 diastolic dysfunction. We are discontinuing HCTZ, losartan at discharge, patient clinically appeared dehydrated so she received IV fluids.  Now she feels better, tolerating the diet, and also feels really well and has no dizziness or chest pain and eager to go home. 2.   depression: Continue Risperdal, venlafaxine 3.  Hypothyroidism: Continue Synthyroid.  4.deconditioning: Physical therapy recommended home health physical therapy.  Discharging her home with home health physical therapy.   Discharge Condition: Stable Follow UP With PCP in 1 week     Discharge Instructions  and  Discharge Medications     Allergies as of 08/02/2018      Reactions   Percocet [oxycodone-acetaminophen] Itching, Nausea And Vomiting   Vicodin [hydrocodone-acetaminophen] Itching, Nausea And Vomiting      Medication List    STOP taking these medications   hydrochlorothiazide 25 MG tablet Commonly known as:  HYDRODIURIL   LOSARTAN POTASSIUM PO     TAKE these medications   feeding supplement (ENSURE ENLIVE) Liqd Take 237 mLs by mouth 2 (two) times daily between meals.   risperiDONE 2 MG tablet Commonly known as:  RISPERDAL Take 1 tablet (2 mg total) by mouth at bedtime.   SYNTHROID 125 MCG tablet Generic drug:  levothyroxine Take 125 mcg by mouth daily before breakfast.   venlafaxine XR 75 MG 24 hr capsule Commonly known as:  EFFEXOR-XR Take 2 capsules (150 mg total) by mouth daily.  Diet and Activity recommendation: See Discharge Instructions above   Consults obtained -physical therapy   Major procedures and Radiology Reports - PLEASE review detailed and final reports for all details, in brief -     Ct Head Wo Contrast  Result Date: 07/30/2018 CLINICAL DATA:  Multiple episodes of syncope. EXAM: CT HEAD  WITHOUT CONTRAST TECHNIQUE: Contiguous axial images were obtained from the base of the skull through the vertex without intravenous contrast. COMPARISON:  None. FINDINGS: Brain: No evidence of acute infarction, hemorrhage, hydrocephalus, extra-axial collection or mass lesion/mass effect. Vascular: No hyperdense vessel or unexpected calcification. Skull: Normal. Negative for fracture or focal lesion. Sinuses/Orbits: No acute finding. Other: None. IMPRESSION: Normal head CT. Electronically Signed   By: Marijo Conception, M.D.   On: 07/30/2018 16:28   Ct Chest W Contrast  Result Date: 07/31/2018 CLINICAL DATA:  Unintended weight loss, generalized abdominal pain. EXAM: CT CHEST, ABDOMEN, AND PELVIS WITH CONTRAST TECHNIQUE: Multidetector CT imaging of the chest, abdomen and pelvis was performed following the standard protocol during bolus administration of intravenous contrast. CONTRAST:  44mL ISOVUE-300 IOPAMIDOL (ISOVUE-300) INJECTION 61% COMPARISON:  None. FINDINGS: CT CHEST FINDINGS Cardiovascular: Atherosclerosis of thoracic aorta is noted without aneurysm or dissection. Coronary artery calcifications are noted. Normal cardiac size. No pericardial effusion. Left-sided pacemaker is noted with leads in grossly good position. Mediastinum/Nodes: No enlarged mediastinal, hilar, or axillary lymph nodes. Thyroid gland, trachea, and esophagus demonstrate no significant findings. Lungs/Pleura: Lungs are clear. No pleural effusion or pneumothorax. Musculoskeletal: No chest wall mass or suspicious bone lesions identified. CT ABDOMEN PELVIS FINDINGS Hepatobiliary: No focal liver abnormality is seen. No gallstones, gallbladder wall thickening, or biliary dilatation. Pancreas: Unremarkable. No pancreatic ductal dilatation or surrounding inflammatory changes. Spleen: Normal in size without focal abnormality. Adrenals/Urinary Tract: Adrenal glands are unremarkable. Kidneys are normal, without renal calculi, focal lesion, or  hydronephrosis. Bladder is unremarkable. Stomach/Bowel: Stomach is within normal limits. Appendix appears normal. No evidence of bowel wall thickening, distention, or inflammatory changes. Vascular/Lymphatic: Aortic atherosclerosis. No enlarged abdominal or pelvic lymph nodes. Reproductive: Status post hysterectomy. No adnexal masses. Other: No abdominal wall hernia or abnormality. No abdominopelvic ascites. Musculoskeletal: No acute or significant osseous findings. IMPRESSION: Coronary artery calcifications are noted suggesting coronary artery disease. No acute abnormality seen in the chest, abdomen or pelvis. Aortic Atherosclerosis (ICD10-I70.0). Electronically Signed   By: Marijo Conception, M.D.   On: 07/31/2018 15:01   Ct Abdomen Pelvis W Contrast  Result Date: 07/31/2018 CLINICAL DATA:  Unintended weight loss, generalized abdominal pain. EXAM: CT CHEST, ABDOMEN, AND PELVIS WITH CONTRAST TECHNIQUE: Multidetector CT imaging of the chest, abdomen and pelvis was performed following the standard protocol during bolus administration of intravenous contrast. CONTRAST:  54mL ISOVUE-300 IOPAMIDOL (ISOVUE-300) INJECTION 61% COMPARISON:  None. FINDINGS: CT CHEST FINDINGS Cardiovascular: Atherosclerosis of thoracic aorta is noted without aneurysm or dissection. Coronary artery calcifications are noted. Normal cardiac size. No pericardial effusion. Left-sided pacemaker is noted with leads in grossly good position. Mediastinum/Nodes: No enlarged mediastinal, hilar, or axillary lymph nodes. Thyroid gland, trachea, and esophagus demonstrate no significant findings. Lungs/Pleura: Lungs are clear. No pleural effusion or pneumothorax. Musculoskeletal: No chest wall mass or suspicious bone lesions identified. CT ABDOMEN PELVIS FINDINGS Hepatobiliary: No focal liver abnormality is seen. No gallstones, gallbladder wall thickening, or biliary dilatation. Pancreas: Unremarkable. No pancreatic ductal dilatation or surrounding  inflammatory changes. Spleen: Normal in size without focal abnormality. Adrenals/Urinary Tract: Adrenal glands are unremarkable. Kidneys are normal, without renal calculi, focal lesion,  or hydronephrosis. Bladder is unremarkable. Stomach/Bowel: Stomach is within normal limits. Appendix appears normal. No evidence of bowel wall thickening, distention, or inflammatory changes. Vascular/Lymphatic: Aortic atherosclerosis. No enlarged abdominal or pelvic lymph nodes. Reproductive: Status post hysterectomy. No adnexal masses. Other: No abdominal wall hernia or abnormality. No abdominopelvic ascites. Musculoskeletal: No acute or significant osseous findings. IMPRESSION: Coronary artery calcifications are noted suggesting coronary artery disease. No acute abnormality seen in the chest, abdomen or pelvis. Aortic Atherosclerosis (ICD10-I70.0). Electronically Signed   By: Marijo Conception, M.D.   On: 07/31/2018 15:01   Dg Chest Portable 1 View  Result Date: 07/30/2018 CLINICAL DATA:  Syncope EXAM: PORTABLE CHEST 1 VIEW COMPARISON:  None. FINDINGS: Left pacer in place with leads in the right atrium and right ventricle. Heart is normal size. Lungs clear. No effusions or acute bony abnormality. IMPRESSION: No active disease. Electronically Signed   By: Rolm Baptise M.D.   On: 07/30/2018 17:58    Micro Results     No results found for this or any previous visit (from the past 240 hour(s)).     Today   Subjective:   Debbie Henderson today has no headache,no chest abdominal pain,no new weakness tingling or numbness, feels much better wants to go home today.   Objective:   Blood pressure 124/80, pulse 96, temperature 98 F (36.7 C), temperature source Oral, resp. rate 18, height 5\' 7"  (1.702 m), weight 77.1 kg, SpO2 100 %.   Intake/Output Summary (Last 24 hours) at 08/02/2018 1032 Last data filed at 08/02/2018 1018 Gross per 24 hour  Intake 240 ml  Output 900 ml  Net -660 ml    Exam Awake Alert,  Oriented x 3, No new F.N deficits, Normal affect [place].AT,PERRAL Supple Neck,No JVD, No cervical lymphadenopathy appriciated.  Symmetrical Chest wall movement, Good air movement bilaterally, CTAB RRR,No Gallops,Rubs or new Murmurs, No Parasternal Heave +ve B.Sounds, Abd Soft, Non tender, No organomegaly appriciated, No rebound -guarding or rigidity. No Cyanosis, Clubbing or edema, No new Rash or bruise  Data Review   CBC w Diff:  Lab Results  Component Value Date   WBC 14.4 (H) 07/30/2018   HGB 14.8 07/30/2018   HCT 43.0 07/30/2018   PLT 296 07/30/2018   LYMPHOPCT 27 07/30/2018   MONOPCT 7 07/30/2018   EOSPCT 0 07/30/2018   BASOPCT 0 07/30/2018    CMP:  Lab Results  Component Value Date   NA 140 08/01/2018   K 3.3 (L) 08/01/2018   CL 107 08/01/2018   CO2 25 08/01/2018   BUN 23 08/01/2018   CREATININE 0.90 08/01/2018   PROT 7.7 07/30/2018   ALBUMIN 4.3 07/30/2018   BILITOT 1.0 07/30/2018   ALKPHOS 74 07/30/2018   AST 19 07/30/2018   ALT 16 07/30/2018  .   Total Time in preparing paper work, data evaluation and todays exam - 35 minutes  Epifanio Lesches M.D on 08/02/2018 at 10:32 AM    Note: This dictation was prepared with Dragon dictation along with smaller phrase technology. Any transcriptional errors that result from this process are unintentional.

## 2018-08-02 NOTE — Progress Notes (Signed)
Discharged to home with daughter.  No new medications. No new follow up appointments.

## 2019-01-29 ENCOUNTER — Other Ambulatory Visit (HOSPITAL_COMMUNITY): Payer: Self-pay | Admitting: Otolaryngology

## 2019-01-29 ENCOUNTER — Other Ambulatory Visit: Payer: Self-pay | Admitting: Otolaryngology

## 2019-01-29 DIAGNOSIS — R221 Localized swelling, mass and lump, neck: Secondary | ICD-10-CM

## 2019-02-09 ENCOUNTER — Ambulatory Visit
Admission: RE | Admit: 2019-02-09 | Discharge: 2019-02-09 | Disposition: A | Discharge status: Home / Self Care | Payer: Medicaid Other | Source: Ambulatory Visit | Attending: Otolaryngology | Admitting: Otolaryngology

## 2019-02-09 DIAGNOSIS — R221 Localized swelling, mass and lump, neck: Secondary | ICD-10-CM | POA: Diagnosis not present

## 2019-02-09 HISTORY — DX: Malignant (primary) neoplasm, unspecified: C80.1

## 2019-02-09 LAB — POCT I-STAT CREATININE: Creatinine, Ser: 0.8 mg/dL (ref 0.44–1.00)

## 2019-02-09 MED ORDER — IOHEXOL 300 MG/ML  SOLN
75.0000 mL | Freq: Once | INTRAMUSCULAR | Status: AC | PRN
  Administered 2019-02-09: 75 mL via INTRAVENOUS

## 2019-07-07 IMAGING — CT CT NECK W/ CM
3 of 5 series · 12 of 33 positions shown, 14 images · IV contrast (omnipaque)
Comparison: CT head without contrast 07/30/2018

CLINICAL DATA: Lump on anterior neck. Personal history of uterine
cancer.

EXAM:
CT NECK WITH CONTRAST
TECHNIQUE: Multidetector CT imaging of the neck was performed using the
standard protocol following the bolus administration of intravenous
contrast.
CONTRAST:  75mL OMNIPAQUE IOHEXOL 300 MG/ML  SOLN

[Series 2: axial neck · axial · 0.50mm/px · z∈[-263,-131]mm · 4 of 112 slices shown, 5 images]
[im 23/112  soft-tissue]
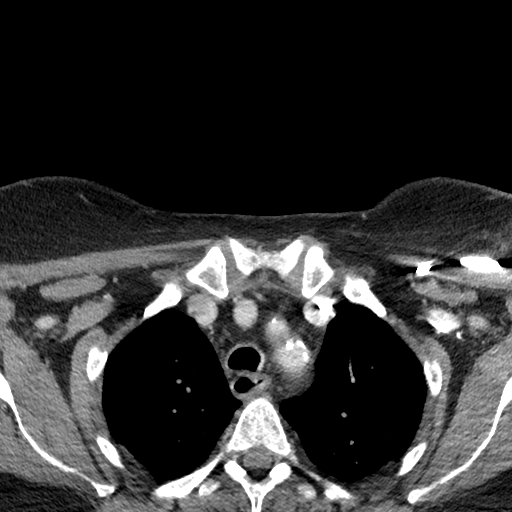
[im 23/112  bone]
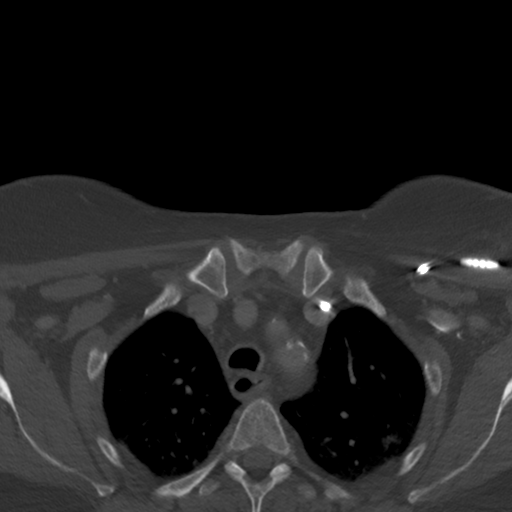
[im 45/112  bone]
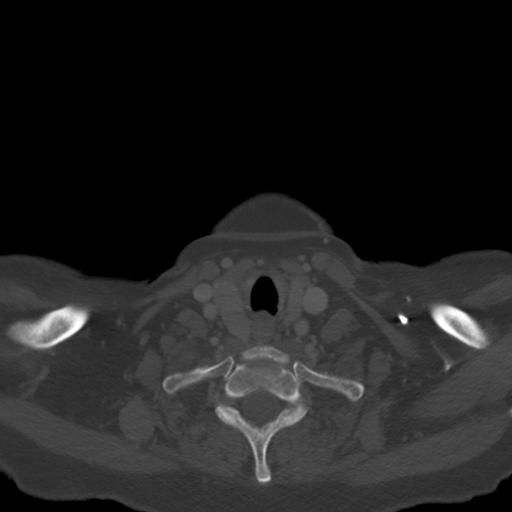
[im 67/112  bone]
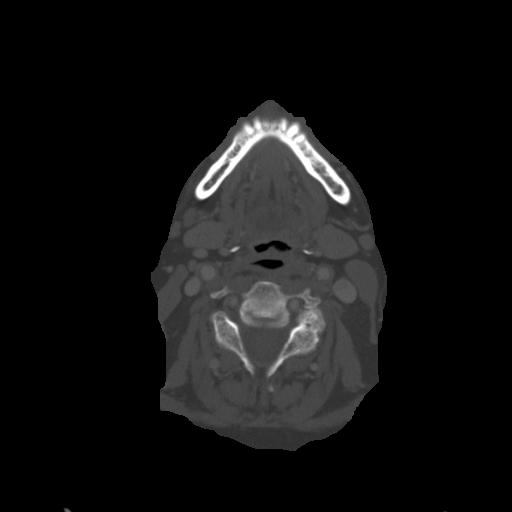
[im 89/112  bone]
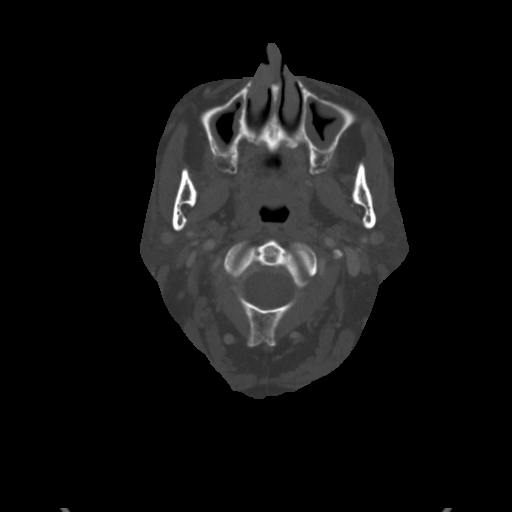

[Series 6: sag neck · sagittal · 0.32mm/px · 5 of 75 slices shown, 6 images]
[im 25/75  bone]
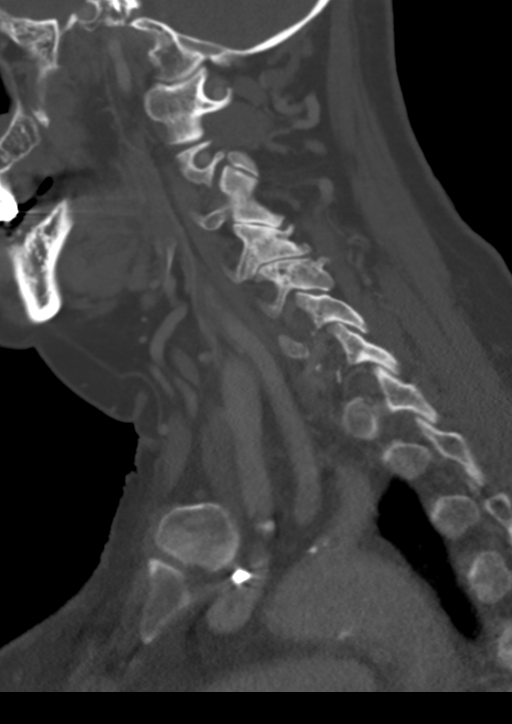
[im 31/75  bone]
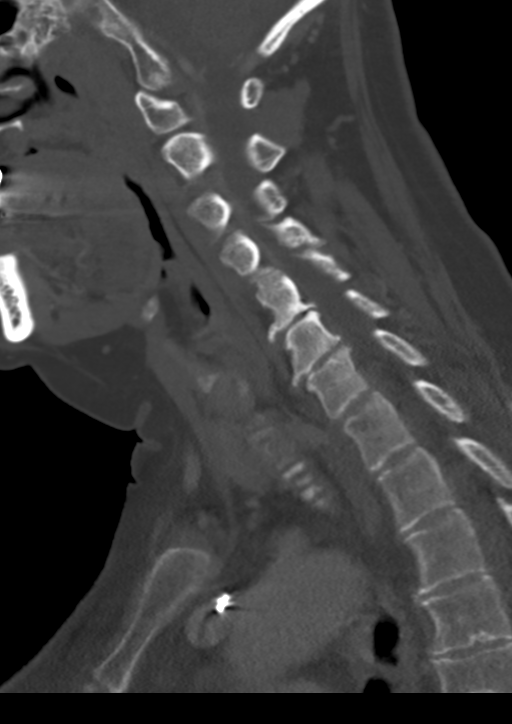
[im 38/75  soft-tissue]
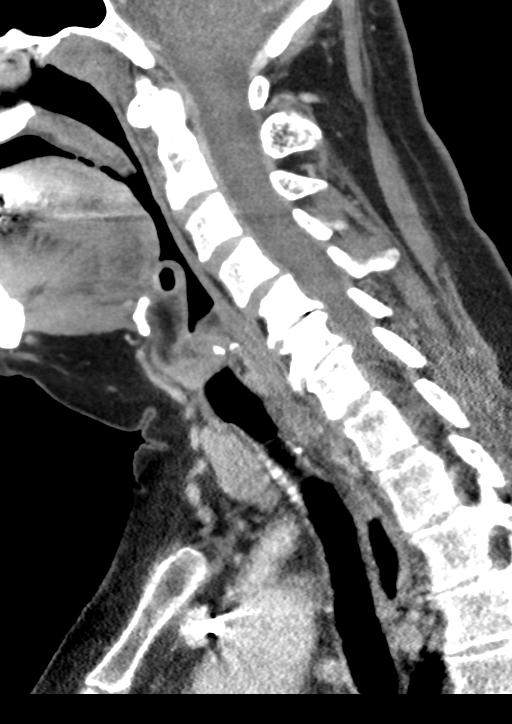
[im 38/75  bone]
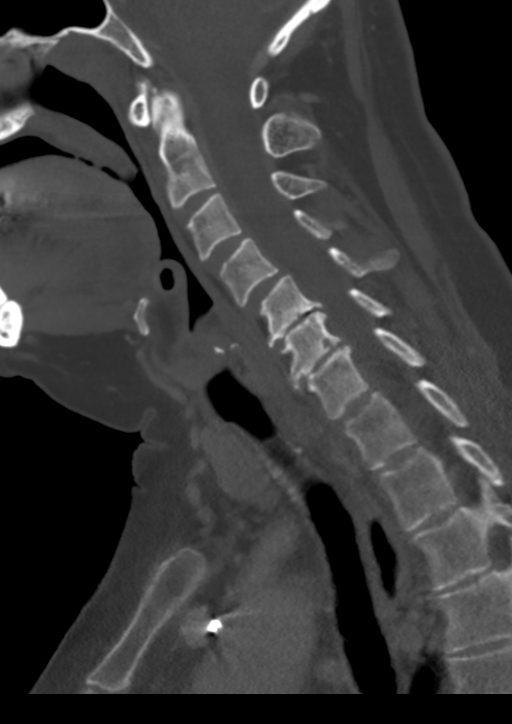
[im 44/75  bone]
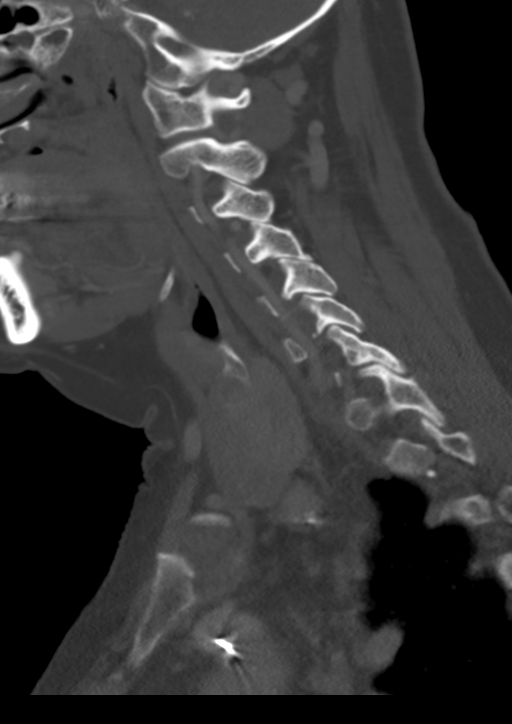
[im 50/75  bone]
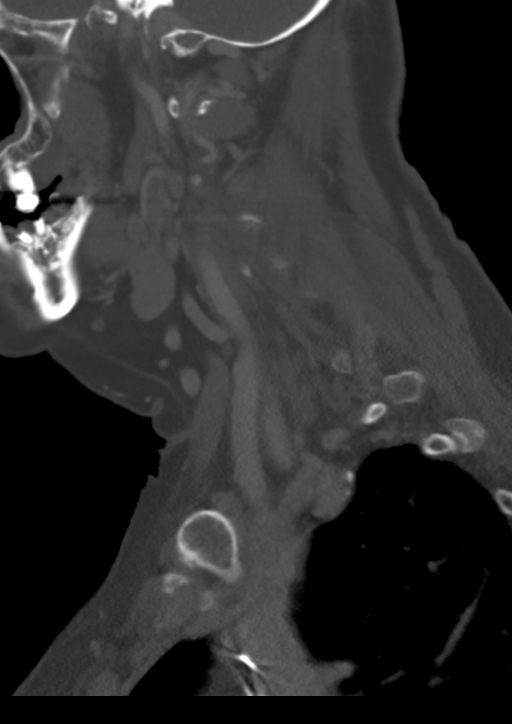

[Series 7: cor neck · coronal · 0.34mm/px · 3 of 105 slices shown]
[im 32/105  bone]
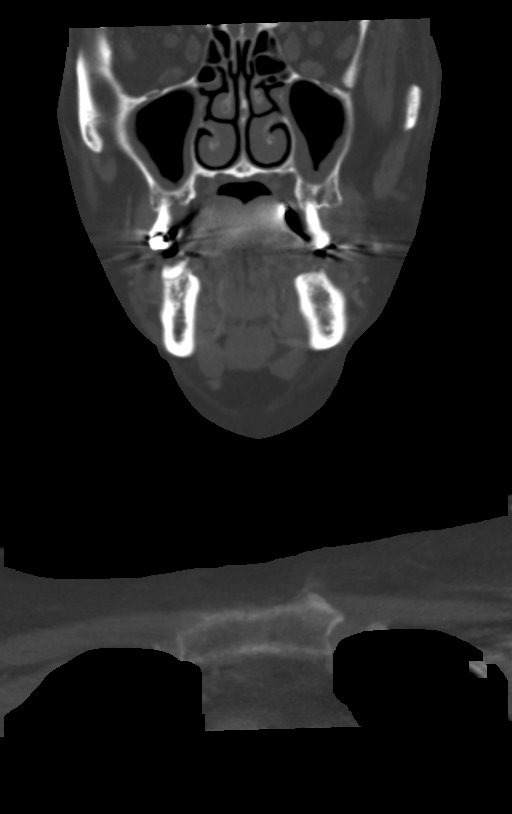
[im 46/105  bone]
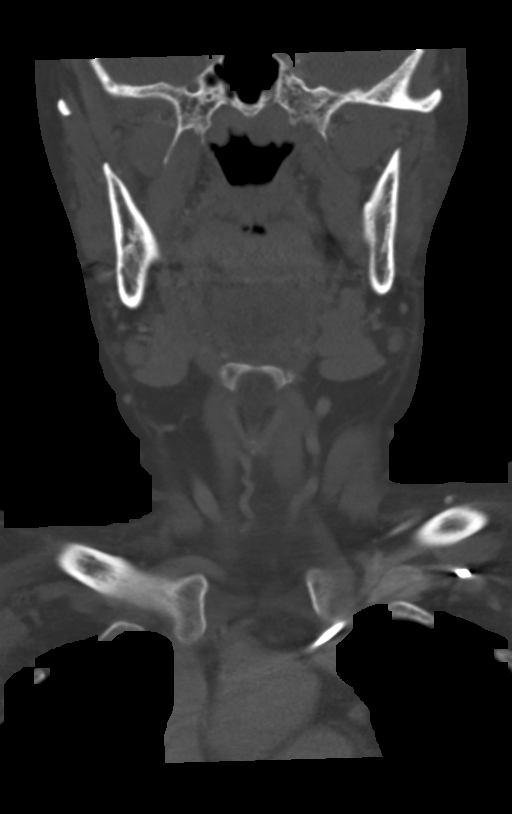
[im 60/105  bone]
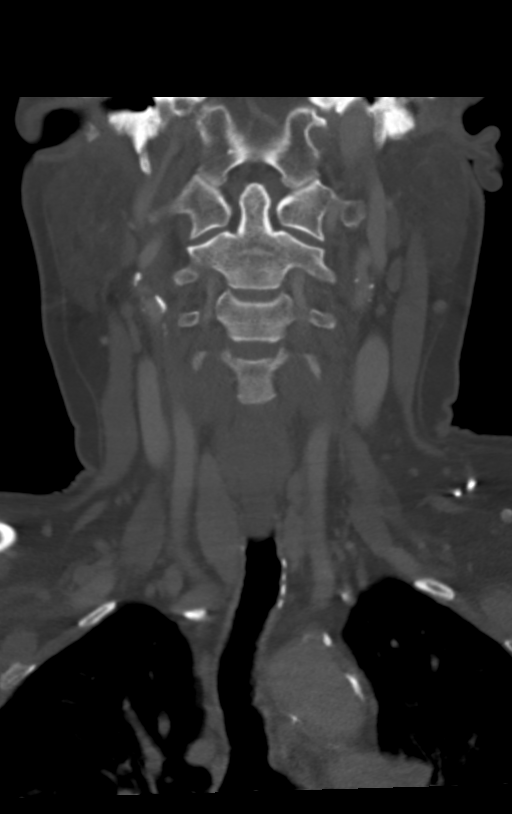

[12 of 33 positions shown; findings below may reference images not displayed]

FINDINGS: Pharynx and larynx: No focal mucosal or submucosal lesions are
present. Is pharynx is normal. Soft palate is within normal limits.
Tongue base is normal. Hypopharynx and epiglottis are normal. Vocal
cords are midline and symmetric. Trachea is unremarkable.

Salivary glands: The submandibular glands and ducts are within
normal limits. Parotid glands are unremarkable.

Thyroid: Thyroid goiter extends inferiorly on the right. No focal
lesion is present.

Lymph nodes: Bilateral reactive type submandibular lymph nodes are
present. This corresponds with the area marked on the right. Disc
symmetric to the left. Reactive type level 2 lymph nodes are present
bilaterally as well. No significant cervical adenopathy is present.

Vascular: Atherosclerotic changes are present at the aortic arch and
bilateral carotid bifurcations. There is no definite stenosis on
either side of greater than 50%. Additional calcifications are
present within the cavernous internal carotid arteries.

Limited intracranial: Within normal limits.

Visualized orbits: Unremarkable.

Mastoids and visualized paranasal sinuses: Mild mucosal thickening
is present in the maxillary sinuses bilaterally. No fluid levels are
present. Fluid is present in the mastoid air cells bilaterally. No
obstructing nasopharyngeal lesion is present. The paranasal sinuses
and mastoid air cells are otherwise clear.

Skeleton: Endplate degenerative changes are most evident C5-6 and
C6-7. Uncovertebral spurring contributes to foraminal narrowing
bilaterally, greatest on the right at C5-6. No focal lytic or
blastic lesions are present. Vertebral body heights are maintained.

Upper chest: Mild dependent atelectasis is present. Ill-defined
patchy areas of airspace disease are present in the left upper lobe.
These are predominantly pleural-based, new from the prior exam. The
largest area is in the left upper lobe on image 47 of series 5
measuring 2.2 x 1.5 cm.
IMPRESSION: 1. Bilateral submandibular and level 2 lymph nodes appear reactive.
This corresponds with the area marked in the right neck.
2. No other associated mass lesion or salivary gland enlargement.
3. Ill-defined patchy airspace opacities in the left upper lobe are
new. These most likely reflect infection or pneumonitis. Recommend
follow-up CT of the chest with contrast after appropriate therapy.
4. Degenerative changes in the cervical spine.
5. Aortic Atherosclerosis ([id number]). Bilateral carotid
bifurcation atherosclerotic changes are present without significant
stenosis of greater than 50%.
6. Mild chronic mucosal disease of the maxillary sinuses
bilaterally.
7. These results will be called to the ordering clinician or
representative by the Radiologist Assistant, and communication
documented in the PACS or zVision Dashboard.
